# Patient Record
Sex: Male | Born: 1935
Health system: Southern US, Community
[De-identification: ages and names within clinical notes are randomized; demographics above are authoritative.]

## PROBLEM LIST (undated history)

## (undated) DIAGNOSIS — G039 Meningitis, unspecified: Secondary | ICD-10-CM

## (undated) DIAGNOSIS — H539 Unspecified visual disturbance: Secondary | ICD-10-CM

## (undated) HISTORY — DX: Unspecified visual disturbance: H53.9

## (undated) HISTORY — DX: Meningitis, unspecified: G03.9

## (undated) HISTORY — PX: OTHER SURGICAL HISTORY: SHX169

---

## 2012-09-02 ENCOUNTER — Other Ambulatory Visit: Payer: Self-pay | Admitting: Internal Medicine

## 2012-09-02 ENCOUNTER — Encounter: Payer: Self-pay | Admitting: Family Medicine

## 2012-09-02 ENCOUNTER — Telehealth: Payer: Self-pay | Admitting: Family Medicine

## 2012-09-02 ENCOUNTER — Ambulatory Visit (INDEPENDENT_AMBULATORY_CARE_PROVIDER_SITE_OTHER): Payer: Medicare Other | Admitting: Family Medicine

## 2012-09-02 VITALS — BP 176/89 | HR 86 | Ht 68.5 in | Wt 158.0 lb

## 2012-09-02 DIAGNOSIS — H269 Unspecified cataract: Secondary | ICD-10-CM

## 2012-09-02 DIAGNOSIS — S04019A Injury of optic nerve, unspecified eye, initial encounter: Secondary | ICD-10-CM

## 2012-09-02 DIAGNOSIS — H35039 Hypertensive retinopathy, unspecified eye: Secondary | ICD-10-CM

## 2012-09-02 DIAGNOSIS — I10 Essential (primary) hypertension: Secondary | ICD-10-CM

## 2012-09-02 DIAGNOSIS — H3581 Retinal edema: Secondary | ICD-10-CM

## 2012-09-02 DIAGNOSIS — H35033 Hypertensive retinopathy, bilateral: Secondary | ICD-10-CM | POA: Insufficient documentation

## 2012-09-02 LAB — COMPLETE METABOLIC PANEL WITH GFR
Alkaline Phosphatase: 73 U/L (ref 39–117)
BUN: 66 mg/dL — ABNORMAL HIGH (ref 6–23)
Creat: 6 mg/dL — ABNORMAL HIGH (ref 0.50–1.35)
GFR, Est Non African American: 8 mL/min — ABNORMAL LOW
Glucose, Bld: 71 mg/dL (ref 70–99)
Total Bilirubin: 0.2 mg/dL — ABNORMAL LOW (ref 0.3–1.2)

## 2012-09-02 LAB — LIPID PANEL
Cholesterol: 122 mg/dL (ref 0–200)
HDL: 42 mg/dL (ref >39)
Total CHOL/HDL Ratio: 2.9 Ratio

## 2012-09-02 MED ORDER — LISINOPRIL 20 MG PO TABS: 20.0000 mg | ORAL_TABLET | Freq: Every day | ORAL | Status: DC

## 2012-09-02 NOTE — Addendum Note (Signed)
Addended by: Nani Gasser D on: 09/02/2012 01:51 PM   Modules accepted: Orders

## 2012-09-02 NOTE — Patient Instructions (Signed)

## 2012-09-02 NOTE — Progress Notes (Addendum)
Subjective:    Patient ID: Peter Lovely., male    DOB: May 10, 1933, 78 y.o.   MRN: [id number]  HPI 78 year old male who is here to establish care today. He has not seen a physician in at least 7 years. Has not had any blood work at that time. He says he does not do vaccines. He does have a very strong family history of high blood pressure. He says he's been told on multiple occasions that his blood pressure is elevated he has never taken medication for it. He is here with his sister today who is strongly encouraging him to get medical care. He recently went to [facility], hear in [place] and saw Dr. Laveda Norman. His sister reports that she noticed any buildup of fluid in that the right optic nerve showed some abnormalities as well as some potential early glaucoma. He was told to follow up with his primary care physician which he did not have. He denies any chest pain or shortness of breath. She does eat a high salt diet. He is very physically active but no regular exercise per se. No dizziness or lightheadedness or headaches.   Review of Systems  Constitutional: Negative for fever, diaphoresis and unexpected weight change.  HENT: Negative for hearing loss, rhinorrhea, sneezing and tinnitus.   Eyes: Negative for visual disturbance.  Respiratory: Negative for cough and wheezing.   Cardiovascular: Negative for chest pain and palpitations.  Gastrointestinal: Negative for nausea, vomiting, diarrhea and blood in stool.  Genitourinary: Negative for dysuria and discharge.  Musculoskeletal: Negative for myalgias and arthralgias.  Skin: Negative for rash.  Neurological: Negative for headaches.  Hematological: Negative for adenopathy.  Psychiatric/Behavioral: Negative for disturbed wake/sleep cycle and dysphoric mood. The patient is not nervous/anxious.       BP 176/89  Pulse 86  Ht 5' 8.5" (1.74 m)  Wt 158 lb (71.668 kg)  BMI 23.67 kg/m2    No Known Allergies  Past Medical History  Diagnosis  Date  . Visual changes   . Meningitis     hospitalized [facility] 15 years ago    Past Surgical History  Procedure Date  . Amputation toe     [place]    History   Social History  . Marital Status: Widowed    Spouse Name: N/A    Number of Children: N/A  . Years of Education: N/A   Occupational History  . 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 Smoking status: Never Smoker   . Smokeless tobacco: Not on file  . Alcohol Use: No  . Drug Use: No  . Sexually Active: Yes -- Male partner(s)     did not comment on   Other Topics Concern  . Not on file   Social History Narrative   2 caffeine drinks per day. Lives with his sister Peter Phillips.      Family History  Problem Relation Age of Onset  . Hypertension Mother   . Hypertension Father   . Hypertension Sister     Outpatient Encounter Prescriptions as of 09/02/2012  Medication Sig Dispense Refill  . lisinopril (PRINIVIL,ZESTRIL) 20 MG tablet Take 1 tablet (20 mg total) by mouth daily.  30 tablet  1       Objective:   Physical Exam  Constitutional: He is oriented to person, place, and time. He appears well-developed and well-nourished.  HENT:  Head: Normocephalic and atraumatic.  Right Ear: External ear  normal.  Left Ear: External ear normal.  Nose: Nose normal.  Mouth/Throat: Oropharynx is clear and moist.       Left TM is blocked by cerumen  Eyes: Conjunctivae normal are normal. Pupils are equal, round, and reactive to light.       Bilateral cataracts  Neck: Neck supple. No thyromegaly present.  Cardiovascular: Normal rate, regular rhythm and normal heart sounds.        No carotid or abdominal bruits.  Pulmonary/Chest: Effort normal and breath sounds normal.  Abdominal: Bowel sounds are normal. He exhibits no distension and no mass.  Musculoskeletal:       Bilateral trace lower extremity edema  Lymphadenopathy:    He has no cervical adenopathy.    Neurological: He is alert and oriented to person, place, and time.  Skin: Skin is warm and dry.  Psychiatric: He has a normal mood and affect. His behavior is normal.          Assessment & Plan:  Hypertension-new diagnosis. Discussed the importance of treating the disease process to reduce his risk of renal disease, stroke and heart disease. We discussed the importance of a low-salt diet. Also discussed the DASH diet. His sister says she will look into it on line. We also discussed starting a medication we'll start lisinopril 20 mg daily. One of potential side effects including cough, lightheadedness, dizziness, potential for angioedema. Followup in one month. Check CMP and lipid panel to better risk stratify him. We'll also check thyroid level and says his new diagnosis.  We will call to get the report about his eye exam [facility].  Addendum-I. did receive the notes from [facility]. He was evidently diagnosed with bilateral cataracts,is a glaucoma suspect, has macular edema and is being referred to retinal specialist and has hypertensive retinopathy. They did recommend setting him up for carotid Dopplers and they're referring him to retina specialist.

## 2012-09-02 NOTE — Telephone Encounter (Signed)
Please call patient and I did get the records from [facility].  He also needs to be scheduled for a Doppler which is an ultrasound of the blood vessels in his neck. We will order in for this and they should contact him soon. It will be performed at the [facility] location which is 8 miles from our office.

## 2012-09-02 NOTE — Telephone Encounter (Signed)
Please call patient and I did get the records from [facility].  He also needs to be scheduled for a Doppler which is an ultrasound of the blood vessels in his neck. We will order in for this and they should contact him soon. It will be performed at the [place] location which is 8 miles from our office. 

## 2012-09-02 NOTE — Telephone Encounter (Signed)
Sister aware.  

## 2012-09-03 LAB — CBC WITH DIFFERENTIAL/PLATELET
Basophils Absolute: 0 10*3/uL (ref 0.0–0.1)
Basophils Relative: 0 % (ref 0–1)
Eosinophils Relative: 1 % (ref 0–5)
HCT: 22.7 % — ABNORMAL LOW (ref 39.0–52.0)
Lymphocytes Relative: 25 % (ref 12–46)
MCHC: 30.4 g/dL (ref 30.0–36.0)
MCV: 79.9 fL (ref 78.0–100.0)
Monocytes Absolute: 0.8 10*3/uL (ref 0.1–1.0)
Monocytes Relative: 12 % (ref 3–12)
RDW: 17.6 % — ABNORMAL HIGH (ref 11.5–15.5)

## 2012-09-04 ENCOUNTER — Ambulatory Visit (INDEPENDENT_AMBULATORY_CARE_PROVIDER_SITE_OTHER): Payer: Medicare Other | Admitting: Family Medicine

## 2012-09-04 ENCOUNTER — Ambulatory Visit: Payer: Medicare Other | Admitting: Family Medicine

## 2012-09-04 ENCOUNTER — Encounter: Payer: Self-pay | Admitting: Family Medicine

## 2012-09-04 VITALS — BP 140/70 | HR 54 | Ht 68.5 in | Wt 158.0 lb

## 2012-09-04 DIAGNOSIS — R972 Elevated prostate specific antigen [PSA]: Secondary | ICD-10-CM

## 2012-09-04 DIAGNOSIS — N19 Unspecified kidney failure: Secondary | ICD-10-CM

## 2012-09-04 DIAGNOSIS — N039 Chronic nephritic syndrome with unspecified morphologic changes: Secondary | ICD-10-CM

## 2012-09-04 DIAGNOSIS — D631 Anemia in chronic kidney disease: Secondary | ICD-10-CM

## 2012-09-04 NOTE — Progress Notes (Signed)
  Subjective:    Patient ID: Peter Phillips., male    DOB: 04/07/1933, 78 y.o.   MRN: [id number]  HPI  He is here to followup on his lab work today. He was seen for the patient in her office approximately 2 days ago. Prior to that he had not seen a physician him is 3 years. He really denied any urinary problems. No frequency. No problems going to the bathroom at all. He did have some trace ankle edema at that time but otherwise no chest pain or shortness of breath.  His sister Aram Beecham is here with him today.  Review of Systems     Objective:   Physical Exam  Constitutional: He appears well-developed and well-nourished.  Skin: Skin is warm and dry.  Psychiatric: He has a normal mood and affect. His behavior is normal.          Assessment & Plan:  Unfortunately his lab  orresults showed that he is in renal failure. His creatinine was 6.0 and his BUN was elevated as well. It is very interesting that he has not noticed any problems with urination or decrease in urination. We discussed scheduling him with a nephrologist ASAP. He is okay with going to [place] to schedule at [facility] in [place].  Lab results also showed an elevated PSA, greater than 100. This is extremely concerning for prostate cancer. At a level that high it is most likely metastatic. No infection is certainly possible that he's not having any discomfort pain chills or fever. I spoke to the radiologist about what imaging would be best especially with his renal function. They recommended CT without contrast as well as a bone scan. He already has carotid Dopplers scheduled at meds her [place] on Mondays we will try to schedule the CT at the same time. The bone scan will have to be done at [facility].    He also is very anemic. Hemoglobin is 7.0. He says completely asymptomatic. Denies any dizziness lightheadedness fatigue. This is most likely secondary to his renal disease. He wanted additional blood work  including parathyroid levels but I will leave this up to the nephrologist.

## 2012-09-07 ENCOUNTER — Ambulatory Visit (HOSPITAL_BASED_OUTPATIENT_CLINIC_OR_DEPARTMENT_OTHER)
Admission: RE | Admit: 2012-09-07 | Discharge: 2012-09-07 | Disposition: A | Discharge status: Home / Self Care | Payer: Medicare Other | Source: Ambulatory Visit | Attending: Family Medicine | Admitting: Family Medicine

## 2012-09-07 ENCOUNTER — Telehealth: Payer: Self-pay | Admitting: *Deleted

## 2012-09-07 ENCOUNTER — Other Ambulatory Visit: Payer: Self-pay | Admitting: Family Medicine

## 2012-09-07 DIAGNOSIS — I129 Hypertensive chronic kidney disease with stage 1 through stage 4 chronic kidney disease, or unspecified chronic kidney disease: Secondary | ICD-10-CM | POA: Insufficient documentation

## 2012-09-07 DIAGNOSIS — H3581 Retinal edema: Secondary | ICD-10-CM

## 2012-09-07 DIAGNOSIS — R142 Eructation: Secondary | ICD-10-CM | POA: Insufficient documentation

## 2012-09-07 DIAGNOSIS — R141 Gas pain: Secondary | ICD-10-CM | POA: Insufficient documentation

## 2012-09-07 DIAGNOSIS — M47817 Spondylosis without myelopathy or radiculopathy, lumbosacral region: Secondary | ICD-10-CM | POA: Insufficient documentation

## 2012-09-07 DIAGNOSIS — M5146 Schmorl's nodes, lumbar region: Secondary | ICD-10-CM | POA: Insufficient documentation

## 2012-09-07 DIAGNOSIS — R972 Elevated prostate specific antigen [PSA]: Secondary | ICD-10-CM | POA: Insufficient documentation

## 2012-09-07 DIAGNOSIS — N1330 Unspecified hydronephrosis: Secondary | ICD-10-CM | POA: Insufficient documentation

## 2012-09-07 DIAGNOSIS — N189 Chronic kidney disease, unspecified: Secondary | ICD-10-CM | POA: Insufficient documentation

## 2012-09-07 DIAGNOSIS — N320 Bladder-neck obstruction: Secondary | ICD-10-CM

## 2012-09-07 DIAGNOSIS — H539 Unspecified visual disturbance: Secondary | ICD-10-CM | POA: Insufficient documentation

## 2012-09-07 DIAGNOSIS — S04019A Injury of optic nerve, unspecified eye, initial encounter: Secondary | ICD-10-CM

## 2012-09-07 DIAGNOSIS — H35039 Hypertensive retinopathy, unspecified eye: Secondary | ICD-10-CM | POA: Insufficient documentation

## 2012-09-07 DIAGNOSIS — G039 Meningitis, unspecified: Secondary | ICD-10-CM | POA: Insufficient documentation

## 2012-09-07 DIAGNOSIS — H35033 Hypertensive retinopathy, bilateral: Secondary | ICD-10-CM

## 2012-09-07 DIAGNOSIS — H269 Unspecified cataract: Secondary | ICD-10-CM | POA: Insufficient documentation

## 2012-09-07 DIAGNOSIS — C61 Malignant neoplasm of prostate: Secondary | ICD-10-CM

## 2012-09-07 DIAGNOSIS — N19 Unspecified kidney failure: Secondary | ICD-10-CM

## 2012-09-07 DIAGNOSIS — I10 Essential (primary) hypertension: Secondary | ICD-10-CM

## 2012-09-07 NOTE — Telephone Encounter (Signed)
Authorization for CT Abdomen & Pelvis w/o contrast obtained from [facility] Medicare- auth# [id number]. Jasmine December at [employer] notified via phone of auth#

## 2012-09-09 ENCOUNTER — Telehealth: Payer: Self-pay | Admitting: Family Medicine

## 2012-09-09 NOTE — Telephone Encounter (Signed)
Called and spoke with patient's sister, Aram Beecham, per patient request. We have her scheduled with urology for Friday. Results of the CT were given her yesterday. She really didn't have any additional questions.

## 2012-09-17 ENCOUNTER — Telehealth: Payer: Self-pay | Admitting: *Deleted

## 2012-09-17 ENCOUNTER — Other Ambulatory Visit: Payer: Self-pay | Admitting: Obstetrics and Gynecology

## 2012-09-17 ENCOUNTER — Other Ambulatory Visit: Payer: Self-pay | Admitting: Urology

## 2012-09-17 DIAGNOSIS — N289 Disorder of kidney and ureter, unspecified: Secondary | ICD-10-CM

## 2012-09-17 NOTE — Telephone Encounter (Signed)
Calls and request to speak with you personally

## 2012-09-22 ENCOUNTER — Telehealth: Payer: Self-pay | Admitting: *Deleted

## 2012-09-22 NOTE — Telephone Encounter (Signed)
Pt sisiter notifed. Called [facility] precert office at [phone] and spoke with Brett Canales who states no pre-cert needed for bone scan. Sisiter notified of this as well.KG LPN

## 2012-09-22 NOTE — Telephone Encounter (Signed)
Wants to speak with you. Seen Dr. Narda Bonds and he has scheduled him for Lasix renogram for Nov 7th at [facility]. We called her and we scheduled him on Nov 12th for a full body scan at [facility] with contrast. Pt sisiter wants to know if needs to do both of these. They told her that the renogram was to evaluate the kidneys and this was the best way.

## 2012-09-22 NOTE — Telephone Encounter (Signed)
Gavin Pound from [employer] called to say that she tried to schedule Peter Phillips for his appt but his sister would like to wait on making his appointment until she talks to someone in our office about him going. Thanks, [employer]

## 2012-09-22 NOTE — Telephone Encounter (Signed)
Yes, need to do both. One is for furthe eval of the kdineys and one is to see if he may have any prostate cancer cells that have spread into the body.

## 2012-09-22 NOTE — Telephone Encounter (Signed)
Pt will have IV contrast on 09/29/2012 study.

## 2012-09-24 ENCOUNTER — Encounter (HOSPITAL_COMMUNITY)
Admission: RE | Admit: 2012-09-24 | Discharge: 2012-09-24 | Disposition: A | Discharge status: Home / Self Care | Payer: Medicare Other | Source: Ambulatory Visit | Attending: Urology | Admitting: Urology

## 2012-09-24 ENCOUNTER — Other Ambulatory Visit: Payer: Self-pay | Admitting: Urology

## 2012-09-24 DIAGNOSIS — N289 Disorder of kidney and ureter, unspecified: Secondary | ICD-10-CM | POA: Insufficient documentation

## 2012-09-24 DIAGNOSIS — N135 Crossing vessel and stricture of ureter without hydronephrosis: Secondary | ICD-10-CM | POA: Insufficient documentation

## 2012-09-24 DIAGNOSIS — N1330 Unspecified hydronephrosis: Secondary | ICD-10-CM | POA: Insufficient documentation

## 2012-09-24 MED ORDER — TECHNETIUM TC 99M MERTIATIDE: 15.0000 | Freq: Once | INTRAVENOUS | Status: AC | PRN

## 2012-09-24 MED ORDER — FUROSEMIDE 10 MG/ML IJ SOLN
40.0000 mg | Freq: Once | INTRAMUSCULAR | Status: AC
  Administered 2012-09-24: 36 mg via INTRAVENOUS
  Filled 2012-09-24: qty 4

## 2012-09-29 ENCOUNTER — Ambulatory Visit (HOSPITAL_COMMUNITY): Payer: Medicare Other

## 2012-09-29 ENCOUNTER — Encounter (HOSPITAL_COMMUNITY): Payer: Medicare Other

## 2012-09-30 ENCOUNTER — Ambulatory Visit: Payer: Medicare Other | Admitting: Family Medicine

## 2012-10-02 ENCOUNTER — Telehealth: Payer: Self-pay | Admitting: Family Medicine

## 2012-10-02 ENCOUNTER — Encounter: Payer: Self-pay | Admitting: Family Medicine

## 2012-10-02 ENCOUNTER — Ambulatory Visit (INDEPENDENT_AMBULATORY_CARE_PROVIDER_SITE_OTHER): Payer: Medicare Other | Admitting: Family Medicine

## 2012-10-02 VITALS — BP 159/81 | HR 80 | Ht 68.5 in | Wt 154.0 lb

## 2012-10-02 DIAGNOSIS — N139 Obstructive and reflux uropathy, unspecified: Secondary | ICD-10-CM

## 2012-10-02 DIAGNOSIS — I10 Essential (primary) hypertension: Secondary | ICD-10-CM

## 2012-10-02 DIAGNOSIS — N189 Chronic kidney disease, unspecified: Secondary | ICD-10-CM

## 2012-10-02 DIAGNOSIS — R972 Elevated prostate specific antigen [PSA]: Secondary | ICD-10-CM | POA: Insufficient documentation

## 2012-10-02 DIAGNOSIS — N185 Chronic kidney disease, stage 5: Secondary | ICD-10-CM | POA: Insufficient documentation

## 2012-10-02 MED ORDER — METOPROLOL TARTRATE 50 MG PO TABS: 50.0000 mg | ORAL_TABLET | Freq: Two times a day (BID) | ORAL | Status: DC

## 2012-10-02 NOTE — Telephone Encounter (Signed)
Please  call his urologist and see if they still would like [place] to do a whole body bone scan to rule out metastasis for prostate cancer. We have him scheduled on November 25 but if the urologist at this time feels that it's not necessary we could certainly cancel the scan.

## 2012-10-02 NOTE — Progress Notes (Signed)
  Subjective:    Patient ID: Peter Phillips., male    DOB: 06-04-1933, 78 y.o.   MRN: [id number]  HPI Here for f/u for acute renal obstruction. He has had seen the Urologist. He is now performing self catheterizations to help the obstruction. He is scheduled for prostate biopsy in early January. They did go ahead and recommend he schedule his appointment with the nephrologist. Delene Loll been holding off because the nephrologists want him to see a urologist first. He is otherwise doing well. He is tolerating the self catheterizations well. He denies having abdominal pain or other symptoms.  Hypertension-no chest pain or shortness of breath. Taking his medication regularly.no swelling.     Review of Systems     Objective:   Physical Exam  Constitutional: He is oriented to person, place, and time. He appears well-developed and well-nourished.  HENT:  Head: Normocephalic and atraumatic.  Cardiovascular: Normal rate, regular rhythm and normal heart sounds.   Pulmonary/Chest: Effort normal and breath sounds normal.  Neurological: He is alert and oriented to person, place, and time.  Skin: Skin is warm and dry.  Psychiatric: He has a normal mood and affect. His behavior is normal.          Assessment & Plan:  HTN - Not wel controlled.  Will d/c lisinopril bc of renal function and will change to metoprolol. Follow up in 6 weeks to recheck blood pressure.  Chronic kidney disease- at this point I do think he needs to go ahead and see nephrology. The urologist agrees as well. His sister has started in contact with [facility]. She agrees to call them back today to go ahead and schedule an appointment. Would like to recheck his creatinine now that he has completed his antibiotics and he has been self cathing himself.  Elevated PSA-most likely prostate cancer. He schedule for spot in January. His sister is concerned about this. I encouraged her to try call to see if they can mitigate  forward at all possible.  Recheck PSA today. He is scheduled on November 25 for a bone scan to rule out metastasis of the prostate cancer. We will call his urologist to see if they still think this is needed or if they recommend that we hold off at this time.

## 2012-10-02 NOTE — Telephone Encounter (Signed)
Dr. Lenoria Chime nurse will ask and call back. Pt is scheduled for biopsy on 12/23.

## 2012-10-03 LAB — BASIC METABOLIC PANEL WITH GFR
CO2: 18 mEq/L — ABNORMAL LOW (ref 19–32)
GFR, Est African American: 13 mL/min — ABNORMAL LOW
Glucose, Bld: 78 mg/dL (ref 70–99)
Potassium: 4.6 mEq/L (ref 3.5–5.3)
Sodium: 140 mEq/L (ref 135–145)

## 2012-10-03 LAB — PSA: PSA: 91.24 ng/mL — ABNORMAL HIGH (ref <=4.00)

## 2012-10-05 ENCOUNTER — Telehealth: Payer: Self-pay | Admitting: *Deleted

## 2012-10-05 NOTE — Telephone Encounter (Signed)
See other message from Phil Campbell

## 2012-10-06 NOTE — Telephone Encounter (Signed)
Dr. Retta Diones felt we could hold off on the bone scan. Lets call imaging to cancel the study.

## 2012-10-07 NOTE — Telephone Encounter (Signed)
Referral shows that appt has been canceled.

## 2012-10-12 ENCOUNTER — Encounter (HOSPITAL_COMMUNITY): Payer: Medicare Other

## 2012-10-12 ENCOUNTER — Ambulatory Visit (HOSPITAL_COMMUNITY): Payer: Medicare Other

## 2012-11-05 ENCOUNTER — Other Ambulatory Visit (HOSPITAL_COMMUNITY): Payer: Self-pay | Admitting: *Deleted

## 2012-11-06 ENCOUNTER — Encounter (HOSPITAL_COMMUNITY)
Admission: RE | Admit: 2012-11-06 | Discharge: 2012-11-06 | Disposition: A | Discharge status: Home / Self Care | Payer: Medicare Other | Source: Ambulatory Visit | Attending: Nephrology | Admitting: Nephrology

## 2012-11-06 DIAGNOSIS — N189 Chronic kidney disease, unspecified: Secondary | ICD-10-CM | POA: Insufficient documentation

## 2012-11-06 DIAGNOSIS — I129 Hypertensive chronic kidney disease with stage 1 through stage 4 chronic kidney disease, or unspecified chronic kidney disease: Secondary | ICD-10-CM | POA: Insufficient documentation

## 2012-11-06 LAB — POCT HEMOGLOBIN-HEMACUE: Hemoglobin: 6.4 g/dL — CL (ref 13.0–17.0)

## 2012-11-06 MED ORDER — EPOETIN ALFA 10000 UNIT/ML IJ SOLN
10000.0000 [IU] | INTRAMUSCULAR | Status: DC
  Administered 2012-11-06: 10000 [IU] via SUBCUTANEOUS

## 2012-11-06 MED ORDER — EPOETIN ALFA 10000 UNIT/ML IJ SOLN
INTRAMUSCULAR | Status: AC
  Filled 2012-11-06: qty 1

## 2012-11-13 ENCOUNTER — Encounter (HOSPITAL_COMMUNITY)
Admission: RE | Admit: 2012-11-13 | Discharge: 2012-11-13 | Disposition: A | Discharge status: Home / Self Care | Payer: Medicare Other | Source: Ambulatory Visit | Attending: Nephrology | Admitting: Nephrology

## 2012-11-13 ENCOUNTER — Telehealth: Payer: Self-pay | Admitting: Family Medicine

## 2012-11-13 ENCOUNTER — Encounter: Payer: Self-pay | Admitting: Family Medicine

## 2012-11-13 ENCOUNTER — Ambulatory Visit (INDEPENDENT_AMBULATORY_CARE_PROVIDER_SITE_OTHER): Payer: Medicare Other | Admitting: Family Medicine

## 2012-11-13 VITALS — BP 149/80 | HR 69 | Resp 18 | Wt 168.0 lb

## 2012-11-13 DIAGNOSIS — N189 Chronic kidney disease, unspecified: Secondary | ICD-10-CM

## 2012-11-13 DIAGNOSIS — R972 Elevated prostate specific antigen [PSA]: Secondary | ICD-10-CM

## 2012-11-13 DIAGNOSIS — N139 Obstructive and reflux uropathy, unspecified: Secondary | ICD-10-CM

## 2012-11-13 DIAGNOSIS — I10 Essential (primary) hypertension: Secondary | ICD-10-CM

## 2012-11-13 LAB — POCT HEMOGLOBIN-HEMACUE: Hemoglobin: 6.8 g/dL — CL (ref 13.0–17.0)

## 2012-11-13 MED ORDER — FERUMOXYTOL INJECTION 510 MG/17 ML
510.0000 mg | INTRAVENOUS | Status: DC
  Administered 2012-11-13: 510 mg via INTRAVENOUS

## 2012-11-13 MED ORDER — AMLODIPINE BESYLATE 5 MG PO TABS: 5.0000 mg | ORAL_TABLET | Freq: Every day | ORAL | Status: DC

## 2012-11-13 MED ORDER — EPOETIN ALFA 10000 UNIT/ML IJ SOLN
10000.0000 [IU] | INTRAMUSCULAR | Status: DC
  Administered 2012-11-13: 10000 [IU] via SUBCUTANEOUS

## 2012-11-13 MED ORDER — METOPROLOL TARTRATE 50 MG PO TABS: 100.0000 mg | ORAL_TABLET | Freq: Two times a day (BID) | ORAL | Status: DC

## 2012-11-13 MED ORDER — EPOETIN ALFA 10000 UNIT/ML IJ SOLN
INTRAMUSCULAR | Status: AC
  Administered 2012-11-13: 10000 [IU] via SUBCUTANEOUS
  Filled 2012-11-13: qty 1

## 2012-11-13 MED ORDER — SODIUM CHLORIDE 0.9 % IV SOLN: INTRAVENOUS | Status: DC

## 2012-11-13 MED ORDER — FERUMOXYTOL INJECTION 510 MG/17 ML
INTRAVENOUS | Status: AC
  Administered 2012-11-13: 510 mg via INTRAVENOUS
  Filled 2012-11-13: qty 17

## 2012-11-13 NOTE — Progress Notes (Signed)
  Subjective:    Patient ID: Peter Lovely., male    DOB: 11-Jan-1933, 78 y.o.   MRN: [id number]  HPI Now seeing Dr. Hyman Hopes At [facility]. He is now getting procrit injections and schedule for iron infusion.    HTN -  Pt denies chest pain, SOB, dizziness, or heart palpitations.  Taking meds as directed w/o problems.  Denies medication side effects.  He feels well.     Review of Systems     Objective:   Physical Exam  Constitutional: He is oriented to person, place, and time. He appears well-developed and well-nourished.  HENT:  Head: Normocephalic and atraumatic.  Cardiovascular: Normal rate, regular rhythm and normal heart sounds.   Pulmonary/Chest: Effort normal and breath sounds normal.  Neurological: He is alert and oriented to person, place, and time.  Skin: Skin is warm and dry.  Psychiatric: He has a normal mood and affect. His behavior is normal.          Assessment & Plan:  CKD - Seeing Dr. Hyman Hopes. Will call to get notes.   HTN - Much improved but not at goal.  Will increase metoprolol to 100mg  BID.  F.U in 8 weeks.  Doing well.    Obstructive uropathy-he did have a biopsy performed on Monday but we'll not know the results for about another 2 weeks. It sounds like the person who typically interprets the pathology reports is on vacation.  Reminded him to keep his followup in April for his eye exam since the report did show some early cataracts as well as some possible glaucoma and damage from high blood pressure.

## 2012-11-13 NOTE — Telephone Encounter (Signed)
Call [facility]. Like to get notes from his nephrologist, Dr. Hyman Hopes. I have not gotten any notes from her office yet

## 2012-11-16 NOTE — Telephone Encounter (Signed)
Called Dr Marland Mcalpine office and they will fax over the office notes.

## 2012-11-23 ENCOUNTER — Other Ambulatory Visit: Payer: Self-pay | Admitting: Urology

## 2012-11-23 ENCOUNTER — Encounter (HOSPITAL_COMMUNITY)
Admission: RE | Admit: 2012-11-23 | Discharge: 2012-11-23 | Disposition: A | Discharge status: Home / Self Care | Payer: Medicare Other | Source: Ambulatory Visit | Attending: Nephrology | Admitting: Nephrology

## 2012-11-23 DIAGNOSIS — I120 Hypertensive chronic kidney disease with stage 5 chronic kidney disease or end stage renal disease: Secondary | ICD-10-CM | POA: Insufficient documentation

## 2012-11-23 DIAGNOSIS — N185 Chronic kidney disease, stage 5: Secondary | ICD-10-CM | POA: Insufficient documentation

## 2012-11-23 DIAGNOSIS — C61 Malignant neoplasm of prostate: Secondary | ICD-10-CM

## 2012-11-23 DIAGNOSIS — D631 Anemia in chronic kidney disease: Secondary | ICD-10-CM | POA: Insufficient documentation

## 2012-11-23 LAB — POCT HEMOGLOBIN-HEMACUE: Hemoglobin: 7.5 g/dL — ABNORMAL LOW (ref 13.0–17.0)

## 2012-11-23 MED ORDER — FERUMOXYTOL INJECTION 510 MG/17 ML
INTRAVENOUS | Status: AC
  Administered 2012-11-23: 510 mg
  Filled 2012-11-23: qty 17

## 2012-11-23 MED ORDER — EPOETIN ALFA 10000 UNIT/ML IJ SOLN
10000.0000 [IU] | INTRAMUSCULAR | Status: DC
  Administered 2012-11-23: 10000 [IU] via SUBCUTANEOUS

## 2012-11-23 MED ORDER — EPOETIN ALFA 10000 UNIT/ML IJ SOLN
INTRAMUSCULAR | Status: AC
  Filled 2012-11-23: qty 1

## 2012-11-23 MED ORDER — FERUMOXYTOL INJECTION 510 MG/17 ML
510.0000 mg | INTRAVENOUS | Status: DC
Start: 2012-11-23 — End: 2012-11-24

## 2012-11-23 MED ORDER — SODIUM CHLORIDE 0.9 % IV SOLN
INTRAVENOUS | Status: AC
  Administered 2012-11-23: 11:00:00 via INTRAVENOUS

## 2012-11-30 ENCOUNTER — Encounter (HOSPITAL_COMMUNITY)
Admission: RE | Admit: 2012-11-30 | Discharge: 2012-11-30 | Disposition: A | Discharge status: Home / Self Care | Payer: Medicare Other | Source: Ambulatory Visit | Attending: Nephrology | Admitting: Nephrology

## 2012-11-30 LAB — IRON AND TIBC: UIBC: 216 ug/dL (ref 125–400)

## 2012-11-30 MED ORDER — CLONIDINE HCL 0.1 MG PO TABS
ORAL_TABLET | ORAL | Status: AC
  Filled 2012-11-30: qty 1

## 2012-11-30 MED ORDER — EPOETIN ALFA 10000 UNIT/ML IJ SOLN
INTRAMUSCULAR | Status: AC
  Administered 2012-11-30: 10000 [IU] via SUBCUTANEOUS
  Filled 2012-11-30: qty 1

## 2012-11-30 MED ORDER — EPOETIN ALFA 10000 UNIT/ML IJ SOLN: 10000.0000 [IU] | INTRAMUSCULAR | Status: DC

## 2012-11-30 MED ORDER — EPOETIN ALFA 10000 UNIT/ML IJ SOLN
INTRAMUSCULAR | Status: AC
  Filled 2012-11-30: qty 1

## 2012-11-30 MED ORDER — CLONIDINE HCL 0.1 MG PO TABS
0.1000 mg | ORAL_TABLET | Freq: Once | ORAL | Status: AC | PRN
  Administered 2012-11-30: 0.1 mg via ORAL

## 2012-12-03 ENCOUNTER — Encounter (HOSPITAL_COMMUNITY)
Admission: RE | Admit: 2012-12-03 | Discharge: 2012-12-03 | Disposition: A | Discharge status: Home / Self Care | Payer: Medicare Other | Source: Ambulatory Visit | Attending: Urology | Admitting: Urology

## 2012-12-03 ENCOUNTER — Encounter (HOSPITAL_COMMUNITY): Payer: Self-pay

## 2012-12-03 DIAGNOSIS — C61 Malignant neoplasm of prostate: Secondary | ICD-10-CM | POA: Insufficient documentation

## 2012-12-03 MED ORDER — TECHNETIUM TC 99M MEDRONATE IV KIT
24.0000 | PACK | Freq: Once | INTRAVENOUS | Status: AC | PRN
  Administered 2012-12-03: 24 via INTRAVENOUS

## 2012-12-04 ENCOUNTER — Encounter: Payer: Self-pay | Admitting: Family Medicine

## 2012-12-07 ENCOUNTER — Encounter (HOSPITAL_COMMUNITY)
Admission: RE | Admit: 2012-12-07 | Discharge: 2012-12-07 | Disposition: A | Discharge status: Home / Self Care | Payer: Medicare Other | Source: Ambulatory Visit | Attending: Nephrology | Admitting: Nephrology

## 2012-12-07 ENCOUNTER — Encounter: Payer: Self-pay | Admitting: Internal Medicine

## 2012-12-07 LAB — POCT HEMOGLOBIN-HEMACUE: Hemoglobin: 10.9 g/dL — ABNORMAL LOW (ref 13.0–17.0)

## 2012-12-07 MED ORDER — EPOETIN ALFA 10000 UNIT/ML IJ SOLN
INTRAMUSCULAR | Status: AC
  Filled 2012-12-07: qty 1

## 2012-12-07 MED ORDER — EPOETIN ALFA 10000 UNIT/ML IJ SOLN
10000.0000 [IU] | INTRAMUSCULAR | Status: DC
  Administered 2012-12-07: 10000 [IU] via SUBCUTANEOUS

## 2012-12-08 ENCOUNTER — Ambulatory Visit: Payer: Medicare Other | Admitting: Internal Medicine

## 2012-12-11 ENCOUNTER — Other Ambulatory Visit (HOSPITAL_COMMUNITY): Payer: Self-pay | Admitting: *Deleted

## 2012-12-14 ENCOUNTER — Encounter (HOSPITAL_COMMUNITY)
Admission: RE | Admit: 2012-12-14 | Discharge: 2012-12-14 | Disposition: A | Discharge status: Home / Self Care | Payer: Medicare Other | Source: Ambulatory Visit | Attending: Nephrology | Admitting: Nephrology

## 2012-12-14 LAB — POCT HEMOGLOBIN-HEMACUE: Hemoglobin: 10.3 g/dL — ABNORMAL LOW (ref 13.0–17.0)

## 2012-12-14 MED ORDER — FERUMOXYTOL INJECTION 510 MG/17 ML
INTRAVENOUS | Status: AC
  Administered 2012-12-14: 510 mg via INTRAVENOUS
  Filled 2012-12-14: qty 17

## 2012-12-14 MED ORDER — SODIUM CHLORIDE 0.9 % IV SOLN
INTRAVENOUS | Status: DC
  Administered 2012-12-14: 14:00:00 via INTRAVENOUS

## 2012-12-14 MED ORDER — FERUMOXYTOL INJECTION 510 MG/17 ML
510.0000 mg | INTRAVENOUS | Status: DC
  Administered 2012-12-14: 510 mg via INTRAVENOUS

## 2012-12-14 MED ORDER — EPOETIN ALFA 10000 UNIT/ML IJ SOLN
10000.0000 [IU] | INTRAMUSCULAR | Status: DC
  Administered 2012-12-14: 10000 [IU] via SUBCUTANEOUS

## 2012-12-14 MED ORDER — EPOETIN ALFA 10000 UNIT/ML IJ SOLN
INTRAMUSCULAR | Status: AC
  Administered 2012-12-14: 10000 [IU] via SUBCUTANEOUS
  Filled 2012-12-14: qty 1

## 2012-12-21 ENCOUNTER — Encounter (HOSPITAL_COMMUNITY)
Admission: RE | Admit: 2012-12-21 | Discharge: 2012-12-21 | Disposition: A | Discharge status: Home / Self Care | Payer: Medicare Other | Source: Ambulatory Visit | Attending: Nephrology | Admitting: Nephrology

## 2012-12-21 DIAGNOSIS — N039 Chronic nephritic syndrome with unspecified morphologic changes: Secondary | ICD-10-CM | POA: Insufficient documentation

## 2012-12-21 DIAGNOSIS — I129 Hypertensive chronic kidney disease with stage 1 through stage 4 chronic kidney disease, or unspecified chronic kidney disease: Secondary | ICD-10-CM | POA: Insufficient documentation

## 2012-12-21 DIAGNOSIS — D631 Anemia in chronic kidney disease: Secondary | ICD-10-CM | POA: Insufficient documentation

## 2012-12-21 DIAGNOSIS — N189 Chronic kidney disease, unspecified: Secondary | ICD-10-CM | POA: Insufficient documentation

## 2012-12-21 LAB — POCT HEMOGLOBIN-HEMACUE: Hemoglobin: 10.9 g/dL — ABNORMAL LOW (ref 13.0–17.0)

## 2012-12-21 MED ORDER — FERUMOXYTOL INJECTION 510 MG/17 ML
INTRAVENOUS | Status: AC
  Administered 2012-12-21: 510 mg via INTRAVENOUS
  Filled 2012-12-21: qty 17

## 2012-12-21 MED ORDER — SODIUM CHLORIDE 0.9 % IV SOLN
INTRAVENOUS | Status: AC
  Administered 2012-12-21: 10:00:00 via INTRAVENOUS

## 2012-12-21 MED ORDER — EPOETIN ALFA 10000 UNIT/ML IJ SOLN
INTRAMUSCULAR | Status: AC
  Administered 2012-12-21: 10000 [IU] via SUBCUTANEOUS
  Filled 2012-12-21: qty 1

## 2012-12-21 MED ORDER — EPOETIN ALFA 10000 UNIT/ML IJ SOLN: 10000.0000 [IU] | INTRAMUSCULAR | Status: DC

## 2012-12-21 MED ORDER — FERUMOXYTOL INJECTION 510 MG/17 ML
510.0000 mg | INTRAVENOUS | Status: AC
  Administered 2012-12-21: 510 mg via INTRAVENOUS

## 2012-12-28 ENCOUNTER — Encounter (HOSPITAL_COMMUNITY)
Admission: RE | Admit: 2012-12-28 | Discharge: 2012-12-28 | Disposition: A | Discharge status: Home / Self Care | Payer: Medicare Other | Source: Ambulatory Visit | Attending: Nephrology | Admitting: Nephrology

## 2012-12-28 LAB — POCT HEMOGLOBIN-HEMACUE: Hemoglobin: 13.3 g/dL (ref 13.0–17.0)

## 2012-12-28 LAB — IRON AND TIBC
Saturation Ratios: 22 % (ref 20–55)
UIBC: 181 ug/dL (ref 125–400)

## 2012-12-28 MED ORDER — EPOETIN ALFA 10000 UNIT/ML IJ SOLN: 10000.0000 [IU] | INTRAMUSCULAR | Status: DC

## 2013-01-08 ENCOUNTER — Telehealth: Payer: Self-pay | Admitting: Family Medicine

## 2013-01-08 ENCOUNTER — Ambulatory Visit (INDEPENDENT_AMBULATORY_CARE_PROVIDER_SITE_OTHER): Payer: Medicare Other | Admitting: Family Medicine

## 2013-01-08 ENCOUNTER — Encounter: Payer: Self-pay | Admitting: Family Medicine

## 2013-01-08 VITALS — BP 176/89 | HR 57 | Ht 68.6 in | Wt 170.0 lb

## 2013-01-08 DIAGNOSIS — I120 Hypertensive chronic kidney disease with stage 5 chronic kidney disease or end stage renal disease: Secondary | ICD-10-CM

## 2013-01-08 DIAGNOSIS — N139 Obstructive and reflux uropathy, unspecified: Secondary | ICD-10-CM

## 2013-01-08 DIAGNOSIS — N185 Chronic kidney disease, stage 5: Secondary | ICD-10-CM

## 2013-01-08 DIAGNOSIS — I10 Essential (primary) hypertension: Secondary | ICD-10-CM

## 2013-01-08 DIAGNOSIS — D509 Iron deficiency anemia, unspecified: Secondary | ICD-10-CM

## 2013-01-08 MED ORDER — METOPROLOL TARTRATE 100 MG PO TABS: 100.0000 mg | ORAL_TABLET | Freq: Two times a day (BID) | ORAL | Status: DC

## 2013-01-08 MED ORDER — AMLODIPINE BESYLATE 10 MG PO TABS: 10.0000 mg | ORAL_TABLET | Freq: Every day | ORAL | Status: DC

## 2013-01-08 NOTE — Telephone Encounter (Signed)
LM on Dr. Hebert Soho asst. Line to give [place] a call back. Barry Dienes, LPN

## 2013-01-08 NOTE — Progress Notes (Signed)
  Subjective:    Patient ID: Peter Lovely., male    DOB: June 01, 1933, 79 y.o.   MRN: [id number]  HPI HTN-  Pt denies chest pain, SOB, dizziness, or heart palpitations.  Taking meds as directed w/o problems.  Denies medication side effects. His nephrologist, Dr. Hyman Hopes, recently increased his amlodipine to 10 mg at bedtime. His blood pressure was high when he was there as well. He says recently when he was at the hospital his systolic blood pressure was around 180. He says he feels completely asymptomatic. He reports that he has changed his diet has really cut back on salt, but his sister seems to disagree with him.  He is being followed by Dr. Elvis Coil for chronic kidney disease stage 5.   His kidney function has actually improved since he has been cathing his bladder. There still following his prostate and evaluating that further.    Review of Systems     Objective:   Physical Exam  Constitutional: He is oriented to person, place, and time. He appears well-developed and well-nourished.  HENT:  Head: Normocephalic and atraumatic.  Eyes: Conjunctivae are normal. Pupils are equal, round, and reactive to light.  Cardiovascular: Normal rate, regular rhythm and normal heart sounds.   Pulmonary/Chest: Effort normal and breath sounds normal.  Musculoskeletal: He exhibits no edema.  Neurological: He is alert and oriented to person, place, and time.  Skin: Skin is warm and dry.  Psychiatric: He has a normal mood and affect. His behavior is normal.          Assessment & Plan:  HTN - uncontrolled.  Doing well overall.  I explained to him that now that he is relieved the pressure off his bladder it has really helped his kidneys and now we need to reduce the blood pressure, pressure off of his kidneys to continue to prevent damage to them. I tried to explain to him how the kidneys work as a filter and that having high blood pressure continue meds that his filters long-term. I would like to add  hydralazine to his regimen that we will contact Dr. Marland Mcalpine office to make sure that this is okay before call the medication. It sounds like he may have a followup appointment with Dr. Hyman Hopes next week which is perfect. They can recheck his blood pressure there otherwise follow with me in 6 weeks.  CKD 5 - secondary to obstructive uropathy. His kidney function has improved mildly. Has followup with Dr. Elvis Coil next week.  Obstructive uropathy-has followup with urology in March.  Iron deficiency anemia-he canceled his colonoscopy. He says he is not actively bleeding and says feels he does not need it. I tried to encourage him to reconsider. His sister was here with him today.

## 2013-01-08 NOTE — Telephone Encounter (Signed)
Please call Dr. Virginia Crews office, nephrology in [place], [place] is okay if we add hydralazine for blood pressure control for this patient. He's currently on amlodipine and metoprolol. His blood pressure was still not controlled in our office today. He is still running around 170.

## 2013-01-08 NOTE — Telephone Encounter (Signed)
Shaquina Dr. Hyman Hopes assistant calls back and says she will talk with him and get back to you. May be Monday before hears anything. Barry Dienes, LPN

## 2013-01-11 ENCOUNTER — Encounter (HOSPITAL_COMMUNITY)
Admission: RE | Admit: 2013-01-11 | Discharge: 2013-01-11 | Disposition: A | Discharge status: Home / Self Care | Payer: Medicare Other | Source: Ambulatory Visit | Attending: Nephrology | Admitting: Nephrology

## 2013-01-11 LAB — POCT HEMOGLOBIN-HEMACUE: Hemoglobin: 12.9 g/dL — ABNORMAL LOW (ref 13.0–17.0)

## 2013-01-11 MED ORDER — EPOETIN ALFA 10000 UNIT/ML IJ SOLN: 10000.0000 [IU] | INTRAMUSCULAR | Status: DC

## 2013-01-11 MED ORDER — HYDRALAZINE HCL 25 MG PO TABS: 25.0000 mg | ORAL_TABLET | Freq: Three times a day (TID) | ORAL | Status: DC

## 2013-01-11 NOTE — Telephone Encounter (Signed)
Okay. Please call patient and let him know that we did get a response back from Dr. Hyman Hopes. He is okay with starting hydralazine to better control his blood pressure. I will send a new but she never to his pharmacy. He is a 3, date drugs to try to his best to take it that often. Make sure to keep followup appointment with me in one month if he doesn't already have one.

## 2013-01-11 NOTE — Telephone Encounter (Signed)
Leonides Sake Dr Hebert Soho assistant states Dr Hyman Hopes is ok with he start of the Hydralazine.

## 2013-01-11 NOTE — Telephone Encounter (Signed)
Left message for patient's daughter to call back. 

## 2013-01-12 NOTE — Telephone Encounter (Signed)
LMOM to return call. Rhyder Koegel, LPN  

## 2013-01-22 ENCOUNTER — Other Ambulatory Visit (HOSPITAL_COMMUNITY): Payer: Self-pay | Admitting: *Deleted

## 2013-01-25 ENCOUNTER — Encounter (HOSPITAL_COMMUNITY)
Admission: RE | Admit: 2013-01-25 | Discharge: 2013-01-25 | Disposition: A | Discharge status: Home / Self Care | Payer: Medicare Other | Source: Ambulatory Visit | Attending: Nephrology | Admitting: Nephrology

## 2013-01-25 DIAGNOSIS — I129 Hypertensive chronic kidney disease with stage 1 through stage 4 chronic kidney disease, or unspecified chronic kidney disease: Secondary | ICD-10-CM | POA: Insufficient documentation

## 2013-01-25 DIAGNOSIS — N189 Chronic kidney disease, unspecified: Secondary | ICD-10-CM | POA: Insufficient documentation

## 2013-01-25 LAB — RENAL FUNCTION PANEL
Albumin: 3.6 g/dL (ref 3.5–5.2)
BUN: 44 mg/dL — ABNORMAL HIGH (ref 6–23)
Chloride: 105 mEq/L (ref 96–112)
GFR calc Af Amer: 14 mL/min — ABNORMAL LOW (ref >90)
Glucose, Bld: 86 mg/dL (ref 70–99)
Potassium: 4.4 mEq/L (ref 3.5–5.1)
Sodium: 139 mEq/L (ref 135–145)

## 2013-01-25 LAB — POCT HEMOGLOBIN-HEMACUE: Hemoglobin: 13 g/dL (ref 13.0–17.0)

## 2013-01-25 MED ORDER — EPOETIN ALFA 10000 UNIT/ML IJ SOLN: 10000.0000 [IU] | INTRAMUSCULAR | Status: DC

## 2013-02-05 ENCOUNTER — Other Ambulatory Visit (HOSPITAL_COMMUNITY): Payer: Self-pay | Admitting: *Deleted

## 2013-02-08 ENCOUNTER — Encounter (HOSPITAL_COMMUNITY)
Admission: RE | Admit: 2013-02-08 | Discharge: 2013-02-08 | Disposition: A | Discharge status: Home / Self Care | Payer: Medicare Other | Source: Ambulatory Visit | Attending: Nephrology | Admitting: Nephrology

## 2013-02-08 LAB — POCT HEMOGLOBIN-HEMACUE: Hemoglobin: 13.1 g/dL (ref 13.0–17.0)

## 2013-02-08 MED ORDER — EPOETIN ALFA 10000 UNIT/ML IJ SOLN: 10000.0000 [IU] | INTRAMUSCULAR | Status: DC

## 2013-02-22 ENCOUNTER — Encounter (HOSPITAL_COMMUNITY): Payer: Medicare Other

## 2013-02-28 ENCOUNTER — Other Ambulatory Visit: Payer: Self-pay | Admitting: Family Medicine

## 2013-04-18 ENCOUNTER — Other Ambulatory Visit: Payer: Self-pay | Admitting: Family Medicine

## 2014-06-24 ENCOUNTER — Telehealth: Payer: Self-pay

## 2014-06-24 NOTE — Telephone Encounter (Signed)
Left a message for Peter Phillips to call back to schedule a follow up for HTN and other chronic problems.

## 2015-11-17 DIAGNOSIS — H25813 Combined forms of age-related cataract, bilateral: Secondary | ICD-10-CM | POA: Diagnosis not present

## 2015-11-17 DIAGNOSIS — H35372 Puckering of macula, left eye: Secondary | ICD-10-CM | POA: Diagnosis not present

## 2015-11-17 DIAGNOSIS — H353132 Nonexudative age-related macular degeneration, bilateral, intermediate dry stage: Secondary | ICD-10-CM | POA: Diagnosis not present

## 2015-11-17 DIAGNOSIS — H35342 Macular cyst, hole, or pseudohole, left eye: Secondary | ICD-10-CM | POA: Diagnosis not present

## 2015-11-17 DIAGNOSIS — H40003 Preglaucoma, unspecified, bilateral: Secondary | ICD-10-CM | POA: Diagnosis not present

## 2015-12-14 DIAGNOSIS — H33101 Unspecified retinoschisis, right eye: Secondary | ICD-10-CM | POA: Diagnosis not present

## 2015-12-14 DIAGNOSIS — H35342 Macular cyst, hole, or pseudohole, left eye: Secondary | ICD-10-CM | POA: Diagnosis not present

## 2015-12-14 DIAGNOSIS — H35372 Puckering of macula, left eye: Secondary | ICD-10-CM | POA: Diagnosis not present

## 2015-12-14 DIAGNOSIS — H353132 Nonexudative age-related macular degeneration, bilateral, intermediate dry stage: Secondary | ICD-10-CM | POA: Diagnosis not present

## 2015-12-14 DIAGNOSIS — H25813 Combined forms of age-related cataract, bilateral: Secondary | ICD-10-CM | POA: Diagnosis not present

## 2016-01-23 DIAGNOSIS — H40003 Preglaucoma, unspecified, bilateral: Secondary | ICD-10-CM | POA: Diagnosis not present

## 2016-01-23 DIAGNOSIS — H35372 Puckering of macula, left eye: Secondary | ICD-10-CM | POA: Diagnosis not present

## 2016-01-23 DIAGNOSIS — H35342 Macular cyst, hole, or pseudohole, left eye: Secondary | ICD-10-CM | POA: Diagnosis not present

## 2016-01-23 DIAGNOSIS — H25813 Combined forms of age-related cataract, bilateral: Secondary | ICD-10-CM | POA: Diagnosis not present

## 2016-01-23 DIAGNOSIS — H353132 Nonexudative age-related macular degeneration, bilateral, intermediate dry stage: Secondary | ICD-10-CM | POA: Diagnosis not present

## 2016-03-13 DIAGNOSIS — H35342 Macular cyst, hole, or pseudohole, left eye: Secondary | ICD-10-CM | POA: Diagnosis not present

## 2016-03-13 DIAGNOSIS — H40003 Preglaucoma, unspecified, bilateral: Secondary | ICD-10-CM | POA: Diagnosis not present

## 2016-03-13 DIAGNOSIS — H25813 Combined forms of age-related cataract, bilateral: Secondary | ICD-10-CM | POA: Diagnosis not present

## 2016-03-14 ENCOUNTER — Encounter: Payer: Self-pay | Admitting: Family Medicine

## 2016-03-14 ENCOUNTER — Ambulatory Visit (INDEPENDENT_AMBULATORY_CARE_PROVIDER_SITE_OTHER): Payer: Medicare Other | Admitting: Family Medicine

## 2016-03-14 VITALS — BP 201/97 | HR 94 | Ht 68.6 in | Wt 172.0 lb

## 2016-03-14 DIAGNOSIS — I10 Essential (primary) hypertension: Secondary | ICD-10-CM | POA: Diagnosis not present

## 2016-03-14 DIAGNOSIS — N185 Chronic kidney disease, stage 5: Secondary | ICD-10-CM | POA: Diagnosis not present

## 2016-03-14 DIAGNOSIS — H35033 Hypertensive retinopathy, bilateral: Secondary | ICD-10-CM | POA: Diagnosis not present

## 2016-03-14 DIAGNOSIS — H3581 Retinal edema: Secondary | ICD-10-CM

## 2016-03-14 DIAGNOSIS — E875 Hyperkalemia: Secondary | ICD-10-CM

## 2016-03-14 MED ORDER — AMLODIPINE BESYLATE 10 MG PO TABS: 10.0000 mg | ORAL_TABLET | Freq: Every day | ORAL | Status: DC

## 2016-03-14 MED ORDER — METOPROLOL TARTRATE 100 MG PO TABS: 100.0000 mg | ORAL_TABLET | Freq: Two times a day (BID) | ORAL | Status: DC

## 2016-03-14 NOTE — Progress Notes (Signed)
Subjective:    Patient ID: Peter Cushing., male    DOB: 05/18/1933, 82 y.o.   MRN: [id number]  HPI HTN - She comes in today to restart his blood pressure medications which he has not been taking. He says he needs to get his blood pressure down so that he can have his eye surgery or treatment of glaucoma. Stopped his meds 3 year sago.    He is on eyedrops for his glaucoma and is scheduled for surgery on May11.  CKD-last some I saw him he had obstructive uropathy from his prostate that was causing significant impairment of his renal function at that time his last creatinine was 4.25.  Lab Results  Component Value Date   CREATININE 4.25* 01/25/2013   BUN 44* 01/25/2013   NA 139 01/25/2013   K 4.4 01/25/2013   CL 105 01/25/2013   CO2 22 01/25/2013      Review of Systems  BP 201/97 mmHg  Pulse 94  Ht 5' 8.6" (1.742 m)  Wt 172 lb (78.019 kg)  BMI 25.71 kg/m2  SpO2 100%    No Known Allergies  Past Medical History  Diagnosis Date  . Visual changes   . Meningitis     hospitalized [facility] 15 years ago    Past Surgical History  Procedure Laterality Date  . Amputation toe      s/p infection while in the [place]    Social History   Social History  . Marital Status: Married    Spouse Name: N/A  . Number of Children: N/A  . Years of Education: N/A   Occupational History  . Retired     Sports coach employed   Social History Main Topics  . Smoking status: Never Smoker   . Smokeless tobacco: Not on file  . Alcohol Use: No  . Drug Use: No  . Sexual Activity:    Partners: Female     Comment: did not comment on   Other Topics Concern  . Not on file   Social History Narrative   2 caffeine drinks per day. Lives with his sister Caren Griffins [place].      Family History  Problem Relation Age of Onset  . Hypertension Mother   . Hypertension Father   . Hypertension Sister     Outpatient Encounter Prescriptions as of 03/14/2016  Medication Sig  . amLODipine (NORVASC) 10 MG  tablet Take 1 tablet (10 mg total) by mouth at bedtime.  . ciprofloxacin (CILOXAN) 0.3 % ophthalmic solution   . COMBIGAN 0.2-0.5 % ophthalmic solution   . ketorolac (ACULAR) 0.5 % ophthalmic solution   . metoprolol (LOPRESSOR) 100 MG tablet Take 1 tablet (100 mg total) by mouth 2 (two) times daily.  . prednisoLONE acetate (PRED FORTE) 1 % ophthalmic suspension   . timolol (TIMOPTIC) 0.5 % ophthalmic solution   . [DISCONTINUED] amLODipine (NORVASC) 10 MG tablet Take 1 tablet (10 mg total) by mouth at bedtime.  . [DISCONTINUED] hydrALAZINE (APRESOLINE) 25 MG tablet Take 1 tablet (25 mg total) by mouth 3 (three) times daily.  . [DISCONTINUED] metoprolol (LOPRESSOR) 100 MG tablet Take 1 tablet (100 mg total) by mouth 2 (two) times daily.  . [DISCONTINUED] metoprolol (LOPRESSOR) 50 MG tablet TAKE TWO TABLETS BY MOUTH TWICE DAILY   No facility-administered encounter medications on file as of 03/14/2016.          Objective:   Physical Exam  Constitutional: He is oriented to person, place, and time. He appears well-developed and well-nourished.  HENT:  Head: Normocephalic and atraumatic.  Cardiovascular: Normal rate, regular rhythm and normal heart sounds.   No carotid or abdominal bruits.   Pulmonary/Chest: Effort normal and breath sounds normal.  Abdominal: Soft. Bowel sounds are normal. He exhibits no distension and no mass. There is no tenderness. There is no rebound and no guarding.  Neurological: He is alert and oriented to person, place, and time.  Skin: Skin is warm and dry.  Psychiatric: He has a normal mood and affect. His behavior is normal.          Assessment & Plan:  HTN - Uncontrolled. Will restart metoprolol and amlodipine.  Start the metoprolol and then on Monday  Add the amlodipine.  F/U mid next week to make sure BP is at goal before surgery. I stressed the importance of continuing his medications even after surgery which he says he will likely quit. Explained how  this can still impact his glaucoma and eventually cause blindness.  Gluacoma - scheduled for surgery on May 11th.    CKD- Due to recheck kidney function. We'll also check liver enzymes.

## 2016-03-14 NOTE — Patient Instructions (Addendum)
Start the metoprolol first. It's taken twice a day. On Monday evening add the amlodipine. He will be taking both prescriptions. Please schedule a follow-up appointment around May 3 or fourth says that we can make sure that your blood pressure looks good and if we need to make any adjustments at that time we can before your surgery on the 11th.    DASH Eating Plan DASH stands for "Dietary Approaches to Stop Hypertension." The DASH eating plan is a healthy eating plan that has been shown to reduce high blood pressure (hypertension). Additional health benefits may include reducing the risk of type 2 diabetes mellitus, heart disease, and stroke. The DASH eating plan may also help with weight loss. WHAT DO I NEED TO KNOW ABOUT THE DASH EATING PLAN? For the DASH eating plan, you will follow these general guidelines:  Choose foods with a percent daily value for sodium of less than 5% (as listed on the food label).  Use salt-free seasonings or herbs instead of table salt or sea salt.  Check with your health care provider or pharmacist before using salt substitutes.  Eat lower-sodium products, often labeled as "lower sodium" or "no salt added."  Eat fresh foods.  Eat more vegetables, fruits, and low-fat dairy products.  Choose whole grains. Look for the word "whole" as the first word in the ingredient list.  Choose fish and skinless chicken or [place] more often than red meat. Limit fish, poultry, and meat to 6 oz (170 g) each day.  Limit sweets, desserts, sugars, and sugary drinks.  Choose heart-healthy fats.  Limit cheese to 1 oz (28 g) per day.  Eat more home-cooked food and less restaurant, buffet, and fast food.  Limit fried foods.  Cook foods using methods other than frying.  Limit canned vegetables. If you do use them, rinse them well to decrease the sodium.  When eating at a restaurant, ask that your food be prepared with less salt, or no salt if possible. WHAT FOODS CAN I  EAT? Seek help from a dietitian for individual calorie needs. Grains Whole grain or whole wheat bread. Brown rice. Whole grain or whole wheat pasta. Quinoa, bulgur, and whole grain cereals. Low-sodium cereals. Corn or whole wheat flour tortillas. Whole grain cornbread. Whole grain crackers. Low-sodium crackers. Vegetables Fresh or frozen vegetables (raw, steamed, roasted, or grilled). Low-sodium or reduced-sodium tomato and vegetable juices. Low-sodium or reduced-sodium tomato sauce and paste. Low-sodium or reduced-sodium canned vegetables.  Fruits All fresh, canned (in natural juice), or frozen fruits. Meat and Other Protein Products Ground beef (85% or leaner), grass-fed beef, or beef trimmed of fat. Skinless chicken or [place]. Ground chicken or [place]. Pork trimmed of fat. All fish and seafood. Eggs. Dried beans, peas, or lentils. Unsalted nuts and seeds. Unsalted canned beans. Dairy Low-fat dairy products, such as skim or 1% milk, 2% or reduced-fat cheeses, low-fat ricotta or cottage cheese, or plain low-fat yogurt. Low-sodium or reduced-sodium cheeses. Fats and Oils Tub margarines without trans fats. Light or reduced-fat mayonnaise and salad dressings (reduced sodium). Avocado. Safflower, olive, or canola oils. Natural peanut or almond butter. Other Unsalted popcorn and pretzels. The items listed above may not be a complete list of recommended foods or beverages. Contact your dietitian for more options. WHAT FOODS ARE NOT RECOMMENDED? Grains White bread. White pasta. White rice. Refined cornbread. Bagels and croissants. Crackers that contain trans fat. Vegetables Creamed or fried vegetables. Vegetables in a cheese sauce. Regular canned vegetables. Regular canned tomato sauce and paste.  Regular tomato and vegetable juices. Fruits Dried fruits. Canned fruit in light or heavy syrup. Fruit juice. Meat and Other Protein Products Fatty cuts of meat. Ribs, chicken wings, bacon, sausage,  bologna, salami, chitterlings, fatback, hot dogs, bratwurst, and packaged luncheon meats. Salted nuts and seeds. Canned beans with salt. Dairy Whole or 2% milk, cream, half-and-half, and cream cheese. Whole-fat or sweetened yogurt. Full-fat cheeses or blue cheese. Nondairy creamers and whipped toppings. Processed cheese, cheese spreads, or cheese curds. Condiments Onion and garlic salt, seasoned salt, table salt, and sea salt. Canned and packaged gravies. Worcestershire sauce. Tartar sauce. Barbecue sauce. Teriyaki sauce. Soy sauce, including reduced sodium. Steak sauce. Fish sauce. Oyster sauce. Cocktail sauce. Horseradish. Ketchup and mustard. Meat flavorings and tenderizers. Bouillon cubes. Hot sauce. Tabasco sauce. Marinades. Taco seasonings. Relishes. Fats and Oils Butter, stick margarine, lard, shortening, ghee, and bacon fat. Coconut, palm kernel, or palm oils. Regular salad dressings. Other Pickles and olives. Salted popcorn and pretzels. The items listed above may not be a complete list of foods and beverages to avoid. Contact your dietitian for more information. WHERE CAN I FIND MORE INFORMATION? National Heart, Lung, and Blood Institute: travelstabloid.com   This information is not intended to replace advice given to you by your health care provider. Make sure you discuss any questions you have with your health care provider.   Document Released: 10/24/2011 Document Revised: 11/25/2014 Document Reviewed: 09/08/2013 Elsevier Interactive Patient Education [employer].

## 2016-03-15 LAB — LIPID PANEL
CHOL/HDL RATIO: 5.2 ratio — AB (ref <=5.0)
CHOLESTEROL: 181 mg/dL (ref 125–200)
HDL: 35 mg/dL — AB (ref >=40)
LDL Cholesterol: 120 mg/dL (ref <130)
Triglycerides: 130 mg/dL (ref <150)
VLDL: 26 mg/dL (ref <30)

## 2016-03-15 LAB — COMPLETE METABOLIC PANEL WITH GFR
ALBUMIN: 3.8 g/dL (ref 3.6–5.1)
ALK PHOS: 93 U/L (ref 40–115)
ALT: 7 U/L — ABNORMAL LOW (ref 9–46)
AST: 11 U/L (ref 10–35)
BUN: 54 mg/dL — ABNORMAL HIGH (ref 7–25)
CALCIUM: 6.3 mg/dL — AB (ref 8.6–10.3)
CO2: 20 mmol/L (ref 20–31)
Chloride: 106 mmol/L (ref 98–110)
Creat: 5.83 mg/dL — ABNORMAL HIGH (ref 0.70–1.11)
GFR, EST NON AFRICAN AMERICAN: 8 mL/min — AB (ref >=60)
GFR, Est African American: 10 mL/min — ABNORMAL LOW (ref >=60)
Glucose, Bld: 118 mg/dL — ABNORMAL HIGH (ref 65–99)
POTASSIUM: 5.4 mmol/L — AB (ref 3.5–5.3)
Sodium: 142 mmol/L (ref 135–146)
Total Bilirubin: 0.3 mg/dL (ref 0.2–1.2)
Total Protein: 7.6 g/dL (ref 6.1–8.1)

## 2016-03-15 NOTE — Addendum Note (Signed)
Addended by: Teddy Spike on: 03/15/2016 09:27 AM   Modules accepted: Orders

## 2016-03-18 ENCOUNTER — Other Ambulatory Visit: Payer: Self-pay | Admitting: *Deleted

## 2016-03-18 DIAGNOSIS — E875 Hyperkalemia: Secondary | ICD-10-CM | POA: Diagnosis not present

## 2016-03-18 MED ORDER — AMLODIPINE BESYLATE 10 MG PO TABS: 10.0000 mg | ORAL_TABLET | Freq: Every day | ORAL | Status: DC

## 2016-03-19 LAB — BASIC METABOLIC PANEL
BUN: 51 mg/dL — AB (ref 7–25)
CALCIUM: 6.4 mg/dL — AB (ref 8.6–10.3)
CO2: 21 mmol/L (ref 20–31)
Chloride: 102 mmol/L (ref 98–110)
Creat: 5.73 mg/dL — ABNORMAL HIGH (ref 0.70–1.11)
GLUCOSE: 102 mg/dL — AB (ref 65–99)
POTASSIUM: 5.3 mmol/L (ref 3.5–5.3)
Sodium: 137 mmol/L (ref 135–146)

## 2016-03-21 ENCOUNTER — Encounter: Payer: Self-pay | Admitting: Family Medicine

## 2016-03-21 ENCOUNTER — Ambulatory Visit (INDEPENDENT_AMBULATORY_CARE_PROVIDER_SITE_OTHER): Payer: Medicare Other | Admitting: Family Medicine

## 2016-03-21 VITALS — BP 146/66 | HR 66 | Wt 173.0 lb

## 2016-03-21 DIAGNOSIS — N185 Chronic kidney disease, stage 5: Secondary | ICD-10-CM | POA: Diagnosis not present

## 2016-03-21 DIAGNOSIS — I10 Essential (primary) hypertension: Secondary | ICD-10-CM

## 2016-03-21 NOTE — Progress Notes (Signed)
   Subjective:    Patient ID: Peter Phillips., male    DOB: 1933/06/18, 82 y.o.   MRN: [id number]  HPI Hypertension- Pt denies chest pain, SOB, dizziness, or heart palpitations.  Taking meds as directed w/o problems.  Denies medication side effects.  He says he is really only taking the blood pressure medications that he can get his eye surgery.   CKD 5-  Right now he is not wanting to see the kidney specialist. He was Seeing nephrology back in 2013 but has not been back since.   Review of Systems     Objective:   Physical Exam  Constitutional: He is oriented to person, place, and time. He appears well-developed and well-nourished.  HENT:  Head: Normocephalic and atraumatic.  Cardiovascular: Normal rate, regular rhythm and normal heart sounds.   Pulmonary/Chest: Effort normal and breath sounds normal.  Neurological: He is alert and oriented to person, place, and time.  Skin: Skin is warm and dry.  Psychiatric: He has a normal mood and affect. His behavior is normal.          Assessment & Plan:  HTN - much improved.Almost at goal after 5 days on medication. Continue current regimen. Follow-up in one month. If he starts to feel poorly on the medications we can certainly make adjustments but he really has had an impressive drop in pressure for just adding the amlodipine and metoprolol on board. Strongly encouraged him to continue medications to help reduce further progression of his kidney disease.  CKD 5 - encouraged him to consider going back to see the nephrologist. He says he will let me know after his surgery.

## 2016-03-28 DIAGNOSIS — H35372 Puckering of macula, left eye: Secondary | ICD-10-CM | POA: Diagnosis not present

## 2016-03-28 DIAGNOSIS — I120 Hypertensive chronic kidney disease with stage 5 chronic kidney disease or end stage renal disease: Secondary | ICD-10-CM | POA: Diagnosis not present

## 2016-03-28 DIAGNOSIS — N185 Chronic kidney disease, stage 5: Secondary | ICD-10-CM | POA: Diagnosis not present

## 2016-03-28 DIAGNOSIS — Z7982 Long term (current) use of aspirin: Secondary | ICD-10-CM | POA: Diagnosis not present

## 2016-03-28 DIAGNOSIS — H25811 Combined forms of age-related cataract, right eye: Secondary | ICD-10-CM | POA: Diagnosis not present

## 2016-03-28 DIAGNOSIS — Z79899 Other long term (current) drug therapy: Secondary | ICD-10-CM | POA: Diagnosis not present

## 2016-03-28 DIAGNOSIS — H40003 Preglaucoma, unspecified, bilateral: Secondary | ICD-10-CM | POA: Diagnosis not present

## 2016-03-28 DIAGNOSIS — H527 Unspecified disorder of refraction: Secondary | ICD-10-CM | POA: Diagnosis not present

## 2016-03-28 DIAGNOSIS — H353132 Nonexudative age-related macular degeneration, bilateral, intermediate dry stage: Secondary | ICD-10-CM | POA: Diagnosis not present

## 2016-03-28 DIAGNOSIS — H35033 Hypertensive retinopathy, bilateral: Secondary | ICD-10-CM | POA: Diagnosis not present

## 2016-03-28 DIAGNOSIS — H25813 Combined forms of age-related cataract, bilateral: Secondary | ICD-10-CM | POA: Diagnosis not present

## 2016-03-28 DIAGNOSIS — H35342 Macular cyst, hole, or pseudohole, left eye: Secondary | ICD-10-CM | POA: Diagnosis not present

## 2016-03-28 DIAGNOSIS — H43813 Vitreous degeneration, bilateral: Secondary | ICD-10-CM | POA: Diagnosis not present

## 2016-04-09 DIAGNOSIS — H353132 Nonexudative age-related macular degeneration, bilateral, intermediate dry stage: Secondary | ICD-10-CM | POA: Diagnosis not present

## 2016-04-09 DIAGNOSIS — H25813 Combined forms of age-related cataract, bilateral: Secondary | ICD-10-CM | POA: Diagnosis not present

## 2016-04-09 DIAGNOSIS — H25812 Combined forms of age-related cataract, left eye: Secondary | ICD-10-CM | POA: Diagnosis not present

## 2016-04-09 DIAGNOSIS — I10 Essential (primary) hypertension: Secondary | ICD-10-CM | POA: Diagnosis not present

## 2016-04-09 DIAGNOSIS — Z79899 Other long term (current) drug therapy: Secondary | ICD-10-CM | POA: Diagnosis not present

## 2016-04-09 DIAGNOSIS — H40003 Preglaucoma, unspecified, bilateral: Secondary | ICD-10-CM | POA: Diagnosis not present

## 2016-04-17 ENCOUNTER — Ambulatory Visit (INDEPENDENT_AMBULATORY_CARE_PROVIDER_SITE_OTHER): Payer: Medicare Other | Admitting: Family Medicine

## 2016-04-17 ENCOUNTER — Encounter: Payer: Self-pay | Admitting: Family Medicine

## 2016-04-17 VITALS — BP 158/82 | HR 83 | Wt 170.0 lb

## 2016-04-17 DIAGNOSIS — I10 Essential (primary) hypertension: Secondary | ICD-10-CM

## 2016-04-17 DIAGNOSIS — H6122 Impacted cerumen, left ear: Secondary | ICD-10-CM

## 2016-04-17 DIAGNOSIS — R42 Dizziness and giddiness: Secondary | ICD-10-CM | POA: Diagnosis not present

## 2016-04-17 NOTE — Progress Notes (Signed)
Subjective:    Patient ID: Peter Phillips., male    DOB: Jan 13, 1933, 82 y.o.   MRN: [id number]  HPI Hypertension- Pt denies chest pain, SOB, dizziness, or heart palpitations.  Taking meds as directed w/o problems.  Denies medication side effects.  He says he is taking his medication. Though, his sister who is here with him today as not convinced that he's been taking it regularly.  pt reports that after his eye surgery he began feeling dizzy.  He had his surgery last Tuesday. He says he has a little bit better the last couple of days. He describes it as feeling dizzy especially when he moves around. He denies any syncope or near-syncope. He denies any upper respiratory symptoms or allergy symptoms such as sneezing or nasal congestion etc. No fevers chills or sweats. He denies any nausea after the anesthesia. He did have some type of an IV anesthesia.      Review of Systems   BP 158/82 mmHg  Pulse 83  Wt 170 lb (77.111 kg)  SpO2 97%    No Known Allergies  Past Medical History  Diagnosis Date  . Visual changes   . Meningitis     hospitalized [facility] 15 years ago    Past Surgical History  Procedure Laterality Date  . Amputation toe      s/p infection while in the [place]    Social History   Social History  . Marital Status: Married    Spouse Name: N/A  . Number of Children: N/A  . Years of Education: N/A   Occupational History  . Retired     Sports coach employed   Social History Main Topics  . Smoking status: Never Smoker   . Smokeless tobacco: Not on file  . Alcohol Use: No  . Drug Use: No  . Sexual Activity:    Partners: Female     Comment: did not comment on   Other Topics Concern  . Not on file   Social History Narrative   2 caffeine drinks per day. Lives with his sister Peter Phillips.      Family History  Problem Relation Age of Onset  . Hypertension Mother   . Hypertension Father   . Hypertension Sister     Outpatient Encounter Prescriptions as of  04/17/2016  Medication Sig  . amLODipine (NORVASC) 10 MG tablet Take 1 tablet (10 mg total) by mouth at bedtime.  . ciprofloxacin (CILOXAN) 0.3 % ophthalmic solution   . COMBIGAN 0.2-0.5 % ophthalmic solution   . ketorolac (ACULAR) 0.5 % ophthalmic solution   . metoprolol (LOPRESSOR) 100 MG tablet Take 1 tablet (100 mg total) by mouth 2 (two) times daily.  . prednisoLONE acetate (PRED FORTE) 1 % ophthalmic suspension   . timolol (TIMOPTIC) 0.5 % ophthalmic solution    No facility-administered encounter medications on file as of 04/17/2016.           Objective:   Physical Exam  Constitutional: He is oriented to person, place, and time. He appears well-developed and well-nourished.  HENT:  Head: Normocephalic and atraumatic.  Right Ear: External ear normal.  Left Ear: External ear normal.  Nose: Nose normal.  Mouth/Throat: Oropharynx is clear and moist.  Left ear canal with cerumen impaction. Right TM and canal is clear.  Eyes: Conjunctivae and EOM are normal. Pupils are equal, round, and reactive to light.  Neck: Neck supple. No thyromegaly present.  Cardiovascular: Normal rate and normal heart sounds.   Pulmonary/Chest: Effort  normal and breath sounds normal.  Lymphadenopathy:    He has no cervical adenopathy.  Neurological: He is alert and oriented to person, place, and time.  Skin: Skin is warm and dry.  Psychiatric: He has a normal mood and affect.       Assessment & Plan:  HTN - Uncontrolled today.The day he actually went for his eye surgery his blood pressure was at goal. His sister brought in a copy of the paperwork. It is up a little bit but he says he is taking his medication. Just encouraged him to make sure he is taking it consistently and to really watch salt intake in the diet. I like to see him back in a couple months just to make sure that her blood pressure is coming back down. That he seems very resistant to continuing to take his medications  long-term.  Dizziness-seems to be improving over the last couple of days on its own. Suspect it may be secondary to recent anesthesia. I really don't think it's on the blood pressure medication because his dose is unchanged since prior to surgery and he was not going symptomatically at that time.  Left ear canal cerumen impaction-offer to irrigate versus home drops. Irrigation performed and he tolerated aell.

## 2016-07-18 ENCOUNTER — Ambulatory Visit: Payer: Medicare Other | Admitting: Family Medicine

## 2017-12-24 ENCOUNTER — Other Ambulatory Visit: Payer: Self-pay | Admitting: *Deleted

## 2017-12-24 ENCOUNTER — Telehealth: Payer: Self-pay

## 2017-12-24 DIAGNOSIS — I10 Essential (primary) hypertension: Secondary | ICD-10-CM

## 2017-12-24 DIAGNOSIS — N185 Chronic kidney disease, stage 5: Secondary | ICD-10-CM

## 2017-12-24 NOTE — Telephone Encounter (Signed)
PT's sister Caren Griffins wants to have blood work put in so he can be checked out. He's feeling weak and has chills. No fever. Please call Caren Griffins [phone] to advise what she can do. Offered appt at 74 with Vassie Moselle states she just wants lab work ordered and she'll call in the morning to get him in an acute spot.

## 2017-12-24 NOTE — Telephone Encounter (Signed)
Spoke w/pt's sister and lab slip given.Peter Phillips, [name]

## 2017-12-25 ENCOUNTER — Ambulatory Visit (INDEPENDENT_AMBULATORY_CARE_PROVIDER_SITE_OTHER): Payer: Medicare Other | Admitting: Family Medicine

## 2017-12-25 ENCOUNTER — Encounter: Payer: Self-pay | Admitting: Family Medicine

## 2017-12-25 ENCOUNTER — Ambulatory Visit (INDEPENDENT_AMBULATORY_CARE_PROVIDER_SITE_OTHER): Payer: Medicare Other

## 2017-12-25 VITALS — BP 128/64 | HR 85 | Temp 98.6°F | Wt 150.0 lb

## 2017-12-25 DIAGNOSIS — M25511 Pain in right shoulder: Secondary | ICD-10-CM | POA: Diagnosis not present

## 2017-12-25 DIAGNOSIS — S42291A Other displaced fracture of upper end of right humerus, initial encounter for closed fracture: Secondary | ICD-10-CM

## 2017-12-25 DIAGNOSIS — I10 Essential (primary) hypertension: Secondary | ICD-10-CM | POA: Diagnosis not present

## 2017-12-25 DIAGNOSIS — W01198A Fall on same level from slipping, tripping and stumbling with subsequent striking against other object, initial encounter: Secondary | ICD-10-CM

## 2017-12-25 DIAGNOSIS — G8929 Other chronic pain: Secondary | ICD-10-CM

## 2017-12-25 DIAGNOSIS — N185 Chronic kidney disease, stage 5: Secondary | ICD-10-CM | POA: Diagnosis not present

## 2017-12-25 DIAGNOSIS — I499 Cardiac arrhythmia, unspecified: Secondary | ICD-10-CM

## 2017-12-25 DIAGNOSIS — R600 Localized edema: Secondary | ICD-10-CM

## 2017-12-25 DIAGNOSIS — M19011 Primary osteoarthritis, right shoulder: Secondary | ICD-10-CM | POA: Diagnosis not present

## 2017-12-25 NOTE — Progress Notes (Signed)
Subjective:    Patient ID: Peter Phillips., male    DOB: Jun 15, 1933, 84 y.o.   MRN: [id number]  HPI 84 year old male with a history of hypertension and CKD 5 comes in today complaining of more acute symptoms including weakness and chills for one week.  He has swelling in right arm and hand and both feet for about a week. He tried to go for labs yesterday but they were unable to draw them. They encouraged him to drink water and then come back today.    He is been having some bilateral swelling his in his feet for about 3 weeks.  He is not currently taking any medications and has not for well over a year.  He also reports on Thanksgiving day he was out in the yard and his cane broke.  He fell forward in his shoulder on the right side hit a tree.  He did not seek medical care at that time but since then he has had severely limited range of motion and has had intermittent swelling in his right arm and hand.  Review of Systems  BP 128/64   Pulse 85   Temp 98.6 F (37 C)   Wt 150 lb (68 kg)   BMI 22.41 kg/m     No Known Allergies  Past Medical History:  Diagnosis Date  . Meningitis    hospitalized [facility] 15 years ago  . Visual changes     Past Surgical History:  Procedure Laterality Date  . amputation toe     s/p infection while in the [place] History   Socioeconomic History  . Marital status: Married    Spouse name: Not on file  . Number of children: Not on file  . Years of education: Not on file  . Highest education level: Not on file  Social Needs  . Financial resource strain: Not on file  . Food insecurity - worry: Not on file  . Food insecurity - inability: Not on file  . Transportation needs - medical: Not on file  . Transportation needs - non-medical: Not on file  Occupational History  . Occupation: Retired    Comment: Self employed  Tobacco Use  . Smoking status: Never Smoker  . Smokeless tobacco: Never Used  Substance and Sexual Activity  .  Alcohol use: No  . Drug use: No  . Sexual activity: Yes    Partners: Female    Comment: did not comment on  Other Topics Concern  . Not on file  Social History Narrative   2 caffeine drinks per day. Lives with his sister Caren Griffins [place].      Family History  Problem Relation Age of Onset  . Hypertension Mother   . Hypertension Father   . Hypertension Sister     Outpatient Encounter Medications as of 12/25/2017  Medication Sig  . prednisoLONE acetate (PRED FORTE) 1 % ophthalmic suspension   . timolol (TIMOPTIC) 0.5 % ophthalmic solution   . amLODipine (NORVASC) 10 MG tablet Take 1 tablet (10 mg total) by mouth at bedtime. (Patient not taking: Reported on 12/25/2017)  . ciprofloxacin (CILOXAN) 0.3 % ophthalmic solution   . COMBIGAN 0.2-0.5 % ophthalmic solution   . ketorolac (ACULAR) 0.5 % ophthalmic solution   . metoprolol (LOPRESSOR) 100 MG tablet Take 1 tablet (100 mg total) by mouth 2 (two) times daily. (Patient not taking: Reported on 12/25/2017)   No facility-administered encounter medications on file as of 12/25/2017.  Objective:   Physical Exam  Constitutional: He is oriented to person, place, and time. He appears well-developed and well-nourished.  HENT:  Head: Normocephalic and atraumatic.  Right Ear: External ear normal.  Left Ear: External ear normal.  Nose: Nose normal.  Mouth/Throat: Oropharynx is clear and moist.  TMs and canals are clear.   Eyes: Conjunctivae and EOM are normal. Pupils are equal, round, and reactive to light.  Neck: Neck supple. No thyromegaly present.  Cardiovascular: Normal rate and normal heart sounds.  No murmur heard. Rhythm is irregular but rate is within the normal range.  Pulmonary/Chest: Effort normal and breath sounds normal.  Musculoskeletal:  Bilateral 1+ edema up to the knees bilaterally.  He has 1+ edema in his right hand and forearm.  The head of the humerus is very protuberant on the right shoulder compared to the left.   He is only able to extend his arm 5 may be 5-10 degrees at the most.  He is able to raise his shoulder up.  Lymphadenopathy:    He has no cervical adenopathy.  Neurological: He is alert and oriented to person, place, and time.  Skin: Skin is warm and dry.  Psychiatric: He has a normal mood and affect.        Assessment & Plan:  Bilat lower extremity swelling - likely from renal failure.  Also check a BNP to evaluate for heart failure as well as a TSH to evaluate for thyroid disorder.  HTN - Well controlled. Continue current regimen.    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    Right shoulder injury-he really is experiencing a lot of pain but based on his exam today I am am concerned that the shoulder is actually dislocated and that is causing some chronic swelling in his right arm and hand.  Consider other less likely causes such as a DVT.  Weakness -unclear etiology at this point.  He is not had any infectious symptoms such as as per upper respiratory symptoms, cough fever etc.  It may just be that his renal disease is progressing to the point that he probably needs dialysis.  Irregular heart rhythm-EKG shows rate of 97 bpm.  He has multiple P wave morphologies with an irregularity of the rhythm.  Sign of possible old anterior infarct with possible old Q waves in V2 and V3.  He does have prolonged QT as well.  Unfortunately, I do not have an old EKG for comparison.

## 2017-12-25 NOTE — Addendum Note (Signed)
Addended by: Teddy Spike on: 12/25/2017 12:42 PM   Modules accepted: Orders

## 2017-12-26 ENCOUNTER — Telehealth: Payer: Self-pay | Admitting: Osteopathic Medicine

## 2017-12-26 ENCOUNTER — Encounter: Payer: Self-pay | Admitting: Osteopathic Medicine

## 2017-12-26 DIAGNOSIS — Z4901 Encounter for fitting and adjustment of extracorporeal dialysis catheter: Secondary | ICD-10-CM | POA: Diagnosis not present

## 2017-12-26 DIAGNOSIS — S42291A Other displaced fracture of upper end of right humerus, initial encounter for closed fracture: Secondary | ICD-10-CM | POA: Diagnosis not present

## 2017-12-26 DIAGNOSIS — N185 Chronic kidney disease, stage 5: Secondary | ICD-10-CM | POA: Diagnosis not present

## 2017-12-26 DIAGNOSIS — D649 Anemia, unspecified: Secondary | ICD-10-CM | POA: Diagnosis not present

## 2017-12-26 DIAGNOSIS — I081 Rheumatic disorders of both mitral and tricuspid valves: Secondary | ICD-10-CM | POA: Diagnosis not present

## 2017-12-26 DIAGNOSIS — I517 Cardiomegaly: Secondary | ICD-10-CM | POA: Diagnosis not present

## 2017-12-26 DIAGNOSIS — S42301A Unspecified fracture of shaft of humerus, right arm, initial encounter for closed fracture: Secondary | ICD-10-CM | POA: Diagnosis not present

## 2017-12-26 DIAGNOSIS — I471 Supraventricular tachycardia: Secondary | ICD-10-CM | POA: Diagnosis not present

## 2017-12-26 DIAGNOSIS — B9620 Unspecified Escherichia coli [E. coli] as the cause of diseases classified elsewhere: Secondary | ICD-10-CM | POA: Diagnosis not present

## 2017-12-26 DIAGNOSIS — N170 Acute kidney failure with tubular necrosis: Secondary | ICD-10-CM | POA: Diagnosis not present

## 2017-12-26 DIAGNOSIS — N1330 Unspecified hydronephrosis: Secondary | ICD-10-CM | POA: Diagnosis not present

## 2017-12-26 DIAGNOSIS — N320 Bladder-neck obstruction: Secondary | ICD-10-CM | POA: Diagnosis not present

## 2017-12-26 DIAGNOSIS — N186 End stage renal disease: Secondary | ICD-10-CM | POA: Diagnosis not present

## 2017-12-26 DIAGNOSIS — R9341 Abnormal radiologic findings on diagnostic imaging of renal pelvis, ureter, or bladder: Secondary | ICD-10-CM | POA: Diagnosis not present

## 2017-12-26 DIAGNOSIS — R0989 Other specified symptoms and signs involving the circulatory and respiratory systems: Secondary | ICD-10-CM | POA: Diagnosis not present

## 2017-12-26 DIAGNOSIS — T8789 Other complications of amputation stump: Secondary | ICD-10-CM | POA: Diagnosis not present

## 2017-12-26 DIAGNOSIS — N189 Chronic kidney disease, unspecified: Secondary | ICD-10-CM | POA: Diagnosis not present

## 2017-12-26 DIAGNOSIS — R601 Generalized edema: Secondary | ICD-10-CM | POA: Diagnosis not present

## 2017-12-26 DIAGNOSIS — M25511 Pain in right shoulder: Secondary | ICD-10-CM | POA: Diagnosis not present

## 2017-12-26 DIAGNOSIS — I120 Hypertensive chronic kidney disease with stage 5 chronic kidney disease or end stage renal disease: Secondary | ICD-10-CM | POA: Diagnosis not present

## 2017-12-26 DIAGNOSIS — Z992 Dependence on renal dialysis: Secondary | ICD-10-CM | POA: Diagnosis not present

## 2017-12-26 DIAGNOSIS — R531 Weakness: Secondary | ICD-10-CM | POA: Diagnosis not present

## 2017-12-26 DIAGNOSIS — N261 Atrophy of kidney (terminal): Secondary | ICD-10-CM | POA: Diagnosis not present

## 2017-12-26 DIAGNOSIS — N401 Enlarged prostate with lower urinary tract symptoms: Secondary | ICD-10-CM | POA: Diagnosis not present

## 2017-12-26 DIAGNOSIS — D72829 Elevated white blood cell count, unspecified: Secondary | ICD-10-CM | POA: Diagnosis not present

## 2017-12-26 DIAGNOSIS — I5189 Other ill-defined heart diseases: Secondary | ICD-10-CM | POA: Diagnosis not present

## 2017-12-26 DIAGNOSIS — D631 Anemia in chronic kidney disease: Secondary | ICD-10-CM | POA: Diagnosis not present

## 2017-12-26 DIAGNOSIS — J90 Pleural effusion, not elsewhere classified: Secondary | ICD-10-CM | POA: Diagnosis not present

## 2017-12-26 DIAGNOSIS — N179 Acute kidney failure, unspecified: Secondary | ICD-10-CM | POA: Diagnosis not present

## 2017-12-26 DIAGNOSIS — N3001 Acute cystitis with hematuria: Secondary | ICD-10-CM | POA: Diagnosis not present

## 2017-12-26 DIAGNOSIS — N184 Chronic kidney disease, stage 4 (severe): Secondary | ICD-10-CM | POA: Diagnosis not present

## 2017-12-26 DIAGNOSIS — R600 Localized edema: Secondary | ICD-10-CM | POA: Diagnosis not present

## 2017-12-26 DIAGNOSIS — E872 Acidosis: Secondary | ICD-10-CM | POA: Diagnosis not present

## 2017-12-26 DIAGNOSIS — R918 Other nonspecific abnormal finding of lung field: Secondary | ICD-10-CM | POA: Diagnosis not present

## 2017-12-26 LAB — CBC WITH DIFFERENTIAL/PLATELET
Basophils Absolute: 36 cells/uL (ref 0–200)
Basophils Relative: 0.2 %
EOS ABS: 18 {cells}/uL (ref 15–500)
Eosinophils Relative: 0.1 %
HEMATOCRIT: 14.7 % — AB (ref 38.5–50.0)
Hemoglobin: 4.6 g/dL — CL (ref 13.2–17.1)
LYMPHS ABS: 864 {cells}/uL (ref 850–3900)
MCH: 25.3 pg — AB (ref 27.0–33.0)
MCHC: 31.3 g/dL — ABNORMAL LOW (ref 32.0–36.0)
MCV: 80.8 fL (ref 80.0–100.0)
MPV: 12.8 fL — ABNORMAL HIGH (ref 7.5–12.5)
Monocytes Relative: 6.2 %
NEUTROS PCT: 88.7 %
Neutro Abs: 15966 cells/uL — ABNORMAL HIGH (ref 1500–7800)
PLATELETS: 237 10*3/uL (ref 140–400)
RBC: 1.82 10*6/uL — AB (ref 4.20–5.80)
RDW: 14.7 % (ref 11.0–15.0)
TOTAL LYMPHOCYTE: 4.8 %
WBC: 18 10*3/uL — AB (ref 3.8–10.8)
WBCMIX: 1116 {cells}/uL — AB (ref 200–950)

## 2017-12-26 LAB — COMPLETE METABOLIC PANEL WITH GFR
AG RATIO: 0.9 (calc) — AB (ref 1.0–2.5)
ALBUMIN MSPROF: 3 g/dL — AB (ref 3.6–5.1)
ALKALINE PHOSPHATASE (APISO): 232 U/L — AB (ref 40–115)
ALT: 51 U/L — ABNORMAL HIGH (ref 9–46)
AST: 12 U/L (ref 10–35)
BILIRUBIN TOTAL: 0.3 mg/dL (ref 0.2–1.2)
BUN / CREAT RATIO: 14 (calc) (ref 6–22)
BUN: 103 mg/dL — ABNORMAL HIGH (ref 7–25)
CHLORIDE: 109 mmol/L (ref 98–110)
CO2: 14 mmol/L — ABNORMAL LOW (ref 20–32)
Calcium: 5.2 mg/dL — CL (ref 8.6–10.3)
Creat: 7.3 mg/dL — ABNORMAL HIGH (ref 0.70–1.11)
GFR, EST AFRICAN AMERICAN: 7 mL/min/{1.73_m2} — AB (ref >=60)
GFR, Est Non African American: 6 mL/min/{1.73_m2} — ABNORMAL LOW (ref >=60)
GLOBULIN: 3.4 g/dL (ref 1.9–3.7)
GLUCOSE: 101 mg/dL — AB (ref 65–99)
POTASSIUM: 5.1 mmol/L (ref 3.5–5.3)
SODIUM: 140 mmol/L (ref 135–146)
TOTAL PROTEIN: 6.4 g/dL (ref 6.1–8.1)

## 2017-12-26 LAB — LIPID PANEL
CHOLESTEROL: 108 mg/dL (ref <200)
HDL: 24 mg/dL — AB (ref >40)
LDL CHOLESTEROL (CALC): 64 mg/dL
Non-HDL Cholesterol (Calc): 84 mg/dL (calc) (ref <130)
TRIGLYCERIDES: 115 mg/dL (ref <150)
Total CHOL/HDL Ratio: 4.5 (calc) (ref <5.0)

## 2017-12-26 LAB — URINALYSIS, MICROSCOPIC ONLY
Hyaline Cast: NONE SEEN /LPF
SQUAMOUS EPITHELIAL / LPF: NONE SEEN /HPF (ref <=5)

## 2017-12-26 LAB — TSH: TSH: 0.7 m[IU]/L (ref 0.40–4.50)

## 2017-12-26 LAB — BRAIN NATRIURETIC PEPTIDE: Brain Natriuretic Peptide: 1438 pg/mL — ABNORMAL HIGH (ref <100)

## 2017-12-26 LAB — PTH, INTACT AND CALCIUM
CALCIUM: 5.3 mg/dL — AB (ref 8.6–10.3)
PTH: 225 pg/mL — AB (ref 14–64)

## 2017-12-26 LAB — CBC MORPHOLOGY

## 2017-12-26 MED ORDER — SODIUM CHLORIDE 0.9 % IV SOLN
INTRAVENOUS | Status: DC
Start: ? — End: 2017-12-26

## 2017-12-26 MED ORDER — METOPROLOL TARTRATE 25 MG PO TABS
100.00 | ORAL_TABLET | ORAL | Status: DC
Start: 2017-12-26 — End: 2017-12-26

## 2017-12-26 MED ORDER — SODIUM BICARBONATE 650 MG PO TABS
650.00 | ORAL_TABLET | ORAL | Status: DC
Start: 2017-12-26 — End: 2017-12-26

## 2017-12-26 NOTE — Progress Notes (Signed)
Received critical call around 1 AM for low hemoglobin to 4.6. Reviewed today's progress note and other available labs. Wbc also high. CMP not yet resulted. Called patients number. Spoke to sister, Caren Griffins who stated he was asleep and I could speak to her. Unable to verify DPR but given the nature of the emergency I went ahead and let her know that he had severe anemia that required urgent treatment. She said she would wake him up and ta ke him to the emergency department. I called [facility] in [place] to speak to ED triage, no answer. Will try again in five minutes.

## 2017-12-26 NOTE — Telephone Encounter (Signed)
Error - note entered from [place] is present in Chart Review under Notes tab Happy the patient is admitted and stable at the moment

## 2018-01-02 ENCOUNTER — Telehealth: Payer: Self-pay

## 2018-01-02 MED ORDER — GENERIC EXTERNAL MEDICATION
5.00 | Status: DC
Start: ? — End: 2018-01-02

## 2018-01-02 MED ORDER — DIPHENHYDRAMINE HCL 50 MG/ML IJ SOLN
12.50 | INTRAMUSCULAR | Status: DC
Start: ? — End: 2018-01-02

## 2018-01-02 MED ORDER — ONDANSETRON HCL 4 MG/2ML IJ SOLN
4.00 | INTRAMUSCULAR | Status: DC
Start: ? — End: 2018-01-02

## 2018-01-02 MED ORDER — ALBUMIN HUMAN 25 % IV SOLN
12.50 | INTRAVENOUS | Status: DC
Start: ? — End: 2018-01-02

## 2018-01-02 MED ORDER — ALTEPLASE 2 MG IJ SOLR
2.00 | INTRAMUSCULAR | Status: DC
Start: ? — End: 2018-01-02

## 2018-01-02 MED ORDER — SODIUM CHLORIDE 0.9 % IV SOLN
150.00 | INTRAVENOUS | Status: DC
Start: ? — End: 2018-01-02

## 2018-01-02 MED ORDER — BENZONATATE 100 MG PO CAPS
100.00 | ORAL_CAPSULE | ORAL | Status: DC
Start: ? — End: 2018-01-02

## 2018-01-02 MED ORDER — HYDROCODONE-ACETAMINOPHEN 5-325 MG PO TABS
ORAL_TABLET | ORAL | Status: DC
Start: ? — End: 2018-01-02

## 2018-01-02 MED ORDER — GENERIC EXTERNAL MEDICATION
Status: DC
Start: ? — End: 2018-01-02

## 2018-01-02 MED ORDER — SODIUM CHLORIDE 0.9 % IV SOLN
INTRAVENOUS | Status: DC
Start: ? — End: 2018-01-02

## 2018-01-02 MED ORDER — ACETAMINOPHEN 325 MG PO TABS
650.00 | ORAL_TABLET | ORAL | Status: DC
Start: ? — End: 2018-01-02

## 2018-01-02 MED ORDER — SODIUM CHLORIDE 0.9 % IJ SOLN
50.00 | INTRAMUSCULAR | Status: DC
Start: ? — End: 2018-01-02

## 2018-01-02 MED ORDER — NITROGLYCERIN 0.4 MG SL SUBL
.40 | SUBLINGUAL_TABLET | SUBLINGUAL | Status: DC
Start: ? — End: 2018-01-02

## 2018-01-02 MED ORDER — DARBEPOETIN ALFA 60 MCG/ML IJ SOLN
INTRAMUSCULAR | Status: DC
Start: 2018-01-03 — End: 2018-01-02

## 2018-01-02 MED ORDER — METOPROLOL TARTRATE 50 MG PO TABS
100.00 | ORAL_TABLET | ORAL | Status: DC
Start: 2018-01-07 — End: 2018-01-02

## 2018-01-02 MED ORDER — POLYETHYLENE GLYCOL 3350 17 G PO PACK
17.00 | PACK | ORAL | Status: DC
Start: ? — End: 2018-01-02

## 2018-01-02 MED ORDER — HYDRALAZINE HCL 20 MG/ML IJ SOLN
10.00 | INTRAMUSCULAR | Status: DC
Start: ? — End: 2018-01-02

## 2018-01-02 MED ORDER — SODIUM BICARBONATE 650 MG PO TABS
650.00 | ORAL_TABLET | ORAL | Status: DC
Start: 2018-01-07 — End: 2018-01-02

## 2018-01-02 MED ORDER — CALCIUM CARBONATE ANTACID 500 MG PO CHEW
1000.00 | CHEWABLE_TABLET | ORAL | Status: DC
Start: 2018-01-07 — End: 2018-01-02

## 2018-01-02 MED ORDER — AMLODIPINE BESYLATE 10 MG PO TABS
10.00 | ORAL_TABLET | ORAL | Status: DC
Start: 2018-01-07 — End: 2018-01-02

## 2018-01-02 NOTE — Telephone Encounter (Signed)
Patient's sister called and reports patient is still in the hospital.

## 2018-01-02 NOTE — Telephone Encounter (Signed)
Sorry, she did have me update the mobile number for Mr Sher. I think that was the number she wanted [place] to call.

## 2018-01-02 NOTE — Telephone Encounter (Signed)
Did she leave a different phone number. The one below is her old work number.  It is not a cell or home phone.

## 2018-01-05 ENCOUNTER — Ambulatory Visit: Payer: Medicare Other | Admitting: Family Medicine

## 2018-01-08 ENCOUNTER — Telehealth: Payer: Self-pay | Admitting: Family Medicine

## 2018-01-08 MED ORDER — ONDANSETRON HCL 4 MG/2ML IJ SOLN
4.00 | INTRAMUSCULAR | Status: DC
Start: ? — End: 2018-01-08

## 2018-01-08 MED ORDER — FERROUS SULFATE 325 (65 FE) MG PO TABS
325.00 | ORAL_TABLET | ORAL | Status: DC
Start: 2018-01-07 — End: 2018-01-08

## 2018-01-08 MED ORDER — GENERIC EXTERNAL MEDICATION
Status: DC
Start: ? — End: 2018-01-08

## 2018-01-08 MED ORDER — ALTEPLASE 2 MG IJ SOLR
2.00 | INTRAMUSCULAR | Status: DC
Start: ? — End: 2018-01-08

## 2018-01-08 MED ORDER — HEPARIN SODIUM (PORCINE) 1000 UNIT/ML IJ SOLN
INTRAMUSCULAR | Status: DC
Start: ? — End: 2018-01-08

## 2018-01-08 MED ORDER — SENNA 8.6 MG PO TABS
ORAL_TABLET | ORAL | Status: DC
Start: 2018-01-08 — End: 2018-01-08

## 2018-01-08 MED ORDER — SODIUM CHLORIDE 0.9 % IJ SOLN
50.00 | INTRAMUSCULAR | Status: DC
Start: ? — End: 2018-01-08

## 2018-01-08 MED ORDER — ALBUMIN HUMAN 25 % IV SOLN
12.50 | INTRAVENOUS | Status: DC
Start: ? — End: 2018-01-08

## 2018-01-08 MED ORDER — DIPHENHYDRAMINE HCL 50 MG/ML IJ SOLN
12.50 | INTRAMUSCULAR | Status: DC
Start: ? — End: 2018-01-08

## 2018-01-08 MED ORDER — AMLODIPINE BESYLATE 10 MG PO TABS: 10.0000 mg | ORAL_TABLET | Freq: Every day | ORAL | 5 refills | Status: AC

## 2018-01-08 MED ORDER — SODIUM CHLORIDE 0.9 % IV SOLN
150.00 | INTRAVENOUS | Status: DC
Start: ? — End: 2018-01-08

## 2018-01-08 MED ORDER — METOPROLOL TARTRATE 100 MG PO TABS: 100.0000 mg | ORAL_TABLET | Freq: Two times a day (BID) | ORAL | 5 refills | Status: AC

## 2018-01-08 NOTE — Telephone Encounter (Signed)
Pt's sister called to advise Pt has been discharged from the hospital. While inpatient they were giving him metoprolol and amlodipine. Requesting refills on these Rx's from PCP. Pharmacy on file is correct. Pended old rx's from med list, routing to PCP.

## 2018-01-08 NOTE — Telephone Encounter (Signed)
Meds filled

## 2018-01-10 DIAGNOSIS — D509 Iron deficiency anemia, unspecified: Secondary | ICD-10-CM | POA: Diagnosis not present

## 2018-01-10 DIAGNOSIS — Z1159 Encounter for screening for other viral diseases: Secondary | ICD-10-CM | POA: Diagnosis not present

## 2018-01-10 DIAGNOSIS — N186 End stage renal disease: Secondary | ICD-10-CM | POA: Diagnosis not present

## 2018-01-10 DIAGNOSIS — N2581 Secondary hyperparathyroidism of renal origin: Secondary | ICD-10-CM | POA: Diagnosis not present

## 2018-01-13 DIAGNOSIS — N186 End stage renal disease: Secondary | ICD-10-CM | POA: Diagnosis not present

## 2018-01-13 DIAGNOSIS — N2581 Secondary hyperparathyroidism of renal origin: Secondary | ICD-10-CM | POA: Diagnosis not present

## 2018-01-13 DIAGNOSIS — D509 Iron deficiency anemia, unspecified: Secondary | ICD-10-CM | POA: Diagnosis not present

## 2018-01-15 DIAGNOSIS — D509 Iron deficiency anemia, unspecified: Secondary | ICD-10-CM | POA: Diagnosis not present

## 2018-01-15 DIAGNOSIS — N2581 Secondary hyperparathyroidism of renal origin: Secondary | ICD-10-CM | POA: Diagnosis not present

## 2018-01-15 DIAGNOSIS — N186 End stage renal disease: Secondary | ICD-10-CM | POA: Diagnosis not present

## 2018-01-17 DIAGNOSIS — N2581 Secondary hyperparathyroidism of renal origin: Secondary | ICD-10-CM | POA: Diagnosis not present

## 2018-01-17 DIAGNOSIS — D631 Anemia in chronic kidney disease: Secondary | ICD-10-CM | POA: Diagnosis not present

## 2018-01-17 DIAGNOSIS — D509 Iron deficiency anemia, unspecified: Secondary | ICD-10-CM | POA: Diagnosis not present

## 2018-01-17 DIAGNOSIS — N186 End stage renal disease: Secondary | ICD-10-CM | POA: Diagnosis not present

## 2018-01-20 DIAGNOSIS — D509 Iron deficiency anemia, unspecified: Secondary | ICD-10-CM | POA: Diagnosis not present

## 2018-01-20 DIAGNOSIS — N2581 Secondary hyperparathyroidism of renal origin: Secondary | ICD-10-CM | POA: Diagnosis not present

## 2018-01-20 DIAGNOSIS — N186 End stage renal disease: Secondary | ICD-10-CM | POA: Diagnosis not present

## 2018-01-20 DIAGNOSIS — D631 Anemia in chronic kidney disease: Secondary | ICD-10-CM | POA: Diagnosis not present

## 2018-01-22 DIAGNOSIS — D631 Anemia in chronic kidney disease: Secondary | ICD-10-CM | POA: Diagnosis not present

## 2018-01-22 DIAGNOSIS — D509 Iron deficiency anemia, unspecified: Secondary | ICD-10-CM | POA: Diagnosis not present

## 2018-01-22 DIAGNOSIS — N186 End stage renal disease: Secondary | ICD-10-CM | POA: Diagnosis not present

## 2018-01-22 DIAGNOSIS — N2581 Secondary hyperparathyroidism of renal origin: Secondary | ICD-10-CM | POA: Diagnosis not present

## 2018-01-24 DIAGNOSIS — D509 Iron deficiency anemia, unspecified: Secondary | ICD-10-CM | POA: Diagnosis not present

## 2018-01-24 DIAGNOSIS — D631 Anemia in chronic kidney disease: Secondary | ICD-10-CM | POA: Diagnosis not present

## 2018-01-24 DIAGNOSIS — N186 End stage renal disease: Secondary | ICD-10-CM | POA: Diagnosis not present

## 2018-01-24 DIAGNOSIS — N2581 Secondary hyperparathyroidism of renal origin: Secondary | ICD-10-CM | POA: Diagnosis not present

## 2018-01-27 DIAGNOSIS — D509 Iron deficiency anemia, unspecified: Secondary | ICD-10-CM | POA: Diagnosis not present

## 2018-01-27 DIAGNOSIS — N186 End stage renal disease: Secondary | ICD-10-CM | POA: Diagnosis not present

## 2018-01-29 DIAGNOSIS — N186 End stage renal disease: Secondary | ICD-10-CM | POA: Diagnosis not present

## 2018-01-29 DIAGNOSIS — D509 Iron deficiency anemia, unspecified: Secondary | ICD-10-CM | POA: Diagnosis not present

## 2018-01-31 DIAGNOSIS — D509 Iron deficiency anemia, unspecified: Secondary | ICD-10-CM | POA: Diagnosis not present

## 2018-01-31 DIAGNOSIS — N186 End stage renal disease: Secondary | ICD-10-CM | POA: Diagnosis not present

## 2018-02-03 DIAGNOSIS — N186 End stage renal disease: Secondary | ICD-10-CM | POA: Diagnosis not present

## 2018-02-03 DIAGNOSIS — D509 Iron deficiency anemia, unspecified: Secondary | ICD-10-CM | POA: Diagnosis not present

## 2018-02-05 DIAGNOSIS — D631 Anemia in chronic kidney disease: Secondary | ICD-10-CM | POA: Diagnosis not present

## 2018-02-05 DIAGNOSIS — D509 Iron deficiency anemia, unspecified: Secondary | ICD-10-CM | POA: Diagnosis not present

## 2018-02-05 DIAGNOSIS — N186 End stage renal disease: Secondary | ICD-10-CM | POA: Diagnosis not present

## 2018-02-07 DIAGNOSIS — D631 Anemia in chronic kidney disease: Secondary | ICD-10-CM | POA: Diagnosis not present

## 2018-02-07 DIAGNOSIS — N186 End stage renal disease: Secondary | ICD-10-CM | POA: Diagnosis not present

## 2018-02-07 DIAGNOSIS — D509 Iron deficiency anemia, unspecified: Secondary | ICD-10-CM | POA: Diagnosis not present

## 2018-02-10 DIAGNOSIS — D631 Anemia in chronic kidney disease: Secondary | ICD-10-CM | POA: Diagnosis not present

## 2018-02-10 DIAGNOSIS — N186 End stage renal disease: Secondary | ICD-10-CM | POA: Diagnosis not present

## 2018-02-10 DIAGNOSIS — D509 Iron deficiency anemia, unspecified: Secondary | ICD-10-CM | POA: Diagnosis not present

## 2018-02-12 DIAGNOSIS — D509 Iron deficiency anemia, unspecified: Secondary | ICD-10-CM | POA: Diagnosis not present

## 2018-02-12 DIAGNOSIS — D631 Anemia in chronic kidney disease: Secondary | ICD-10-CM | POA: Diagnosis not present

## 2018-02-12 DIAGNOSIS — N186 End stage renal disease: Secondary | ICD-10-CM | POA: Diagnosis not present

## 2018-02-14 DIAGNOSIS — D509 Iron deficiency anemia, unspecified: Secondary | ICD-10-CM | POA: Diagnosis not present

## 2018-02-14 DIAGNOSIS — D631 Anemia in chronic kidney disease: Secondary | ICD-10-CM | POA: Diagnosis not present

## 2018-02-14 DIAGNOSIS — N186 End stage renal disease: Secondary | ICD-10-CM | POA: Diagnosis not present

## 2018-02-15 DIAGNOSIS — N186 End stage renal disease: Secondary | ICD-10-CM | POA: Diagnosis not present

## 2018-02-15 DIAGNOSIS — Z992 Dependence on renal dialysis: Secondary | ICD-10-CM | POA: Diagnosis not present

## 2018-02-17 DIAGNOSIS — D509 Iron deficiency anemia, unspecified: Secondary | ICD-10-CM | POA: Diagnosis not present

## 2018-02-17 DIAGNOSIS — N186 End stage renal disease: Secondary | ICD-10-CM | POA: Diagnosis not present

## 2018-02-17 DIAGNOSIS — N2581 Secondary hyperparathyroidism of renal origin: Secondary | ICD-10-CM | POA: Diagnosis not present

## 2018-02-18 DIAGNOSIS — Z01810 Encounter for preprocedural cardiovascular examination: Secondary | ICD-10-CM | POA: Diagnosis not present

## 2018-02-18 DIAGNOSIS — N186 End stage renal disease: Secondary | ICD-10-CM | POA: Diagnosis not present

## 2018-02-18 DIAGNOSIS — Z992 Dependence on renal dialysis: Secondary | ICD-10-CM | POA: Diagnosis not present

## 2018-02-19 DIAGNOSIS — N186 End stage renal disease: Secondary | ICD-10-CM | POA: Diagnosis not present

## 2018-02-19 DIAGNOSIS — D509 Iron deficiency anemia, unspecified: Secondary | ICD-10-CM | POA: Diagnosis not present

## 2018-02-19 DIAGNOSIS — N2581 Secondary hyperparathyroidism of renal origin: Secondary | ICD-10-CM | POA: Diagnosis not present

## 2018-02-21 DIAGNOSIS — D509 Iron deficiency anemia, unspecified: Secondary | ICD-10-CM | POA: Diagnosis not present

## 2018-02-21 DIAGNOSIS — N186 End stage renal disease: Secondary | ICD-10-CM | POA: Diagnosis not present

## 2018-02-21 DIAGNOSIS — N2581 Secondary hyperparathyroidism of renal origin: Secondary | ICD-10-CM | POA: Diagnosis not present

## 2018-02-24 DIAGNOSIS — N2581 Secondary hyperparathyroidism of renal origin: Secondary | ICD-10-CM | POA: Diagnosis not present

## 2018-02-24 DIAGNOSIS — D509 Iron deficiency anemia, unspecified: Secondary | ICD-10-CM | POA: Diagnosis not present

## 2018-02-24 DIAGNOSIS — N186 End stage renal disease: Secondary | ICD-10-CM | POA: Diagnosis not present

## 2018-02-26 DIAGNOSIS — D631 Anemia in chronic kidney disease: Secondary | ICD-10-CM | POA: Diagnosis not present

## 2018-02-26 DIAGNOSIS — N186 End stage renal disease: Secondary | ICD-10-CM | POA: Diagnosis not present

## 2018-02-26 DIAGNOSIS — D509 Iron deficiency anemia, unspecified: Secondary | ICD-10-CM | POA: Diagnosis not present

## 2018-02-26 DIAGNOSIS — N2581 Secondary hyperparathyroidism of renal origin: Secondary | ICD-10-CM | POA: Diagnosis not present

## 2018-02-27 DIAGNOSIS — I10 Essential (primary) hypertension: Secondary | ICD-10-CM | POA: Diagnosis not present

## 2018-02-27 DIAGNOSIS — N186 End stage renal disease: Secondary | ICD-10-CM | POA: Diagnosis not present

## 2018-02-27 DIAGNOSIS — Z992 Dependence on renal dialysis: Secondary | ICD-10-CM | POA: Diagnosis not present

## 2018-02-28 DIAGNOSIS — N2581 Secondary hyperparathyroidism of renal origin: Secondary | ICD-10-CM | POA: Diagnosis not present

## 2018-02-28 DIAGNOSIS — D509 Iron deficiency anemia, unspecified: Secondary | ICD-10-CM | POA: Diagnosis not present

## 2018-02-28 DIAGNOSIS — N186 End stage renal disease: Secondary | ICD-10-CM | POA: Diagnosis not present

## 2018-02-28 DIAGNOSIS — D631 Anemia in chronic kidney disease: Secondary | ICD-10-CM | POA: Diagnosis not present

## 2018-03-03 DIAGNOSIS — D509 Iron deficiency anemia, unspecified: Secondary | ICD-10-CM | POA: Diagnosis not present

## 2018-03-03 DIAGNOSIS — N186 End stage renal disease: Secondary | ICD-10-CM | POA: Diagnosis not present

## 2018-03-03 DIAGNOSIS — N2581 Secondary hyperparathyroidism of renal origin: Secondary | ICD-10-CM | POA: Diagnosis not present

## 2018-03-03 DIAGNOSIS — D631 Anemia in chronic kidney disease: Secondary | ICD-10-CM | POA: Diagnosis not present

## 2018-03-05 DIAGNOSIS — N186 End stage renal disease: Secondary | ICD-10-CM | POA: Diagnosis not present

## 2018-03-05 DIAGNOSIS — N2581 Secondary hyperparathyroidism of renal origin: Secondary | ICD-10-CM | POA: Diagnosis not present

## 2018-03-05 DIAGNOSIS — D509 Iron deficiency anemia, unspecified: Secondary | ICD-10-CM | POA: Diagnosis not present

## 2018-03-05 DIAGNOSIS — D631 Anemia in chronic kidney disease: Secondary | ICD-10-CM | POA: Diagnosis not present

## 2018-03-07 DIAGNOSIS — N186 End stage renal disease: Secondary | ICD-10-CM | POA: Diagnosis not present

## 2018-03-07 DIAGNOSIS — N2581 Secondary hyperparathyroidism of renal origin: Secondary | ICD-10-CM | POA: Diagnosis not present

## 2018-03-07 DIAGNOSIS — D631 Anemia in chronic kidney disease: Secondary | ICD-10-CM | POA: Diagnosis not present

## 2018-03-07 DIAGNOSIS — D509 Iron deficiency anemia, unspecified: Secondary | ICD-10-CM | POA: Diagnosis not present

## 2018-03-10 DIAGNOSIS — N2581 Secondary hyperparathyroidism of renal origin: Secondary | ICD-10-CM | POA: Diagnosis not present

## 2018-03-10 DIAGNOSIS — D509 Iron deficiency anemia, unspecified: Secondary | ICD-10-CM | POA: Diagnosis not present

## 2018-03-10 DIAGNOSIS — D631 Anemia in chronic kidney disease: Secondary | ICD-10-CM | POA: Diagnosis not present

## 2018-03-10 DIAGNOSIS — N186 End stage renal disease: Secondary | ICD-10-CM | POA: Diagnosis not present

## 2018-03-10 DIAGNOSIS — Z23 Encounter for immunization: Secondary | ICD-10-CM | POA: Diagnosis not present

## 2018-03-12 DIAGNOSIS — N186 End stage renal disease: Secondary | ICD-10-CM | POA: Diagnosis not present

## 2018-03-12 DIAGNOSIS — N2581 Secondary hyperparathyroidism of renal origin: Secondary | ICD-10-CM | POA: Diagnosis not present

## 2018-03-12 DIAGNOSIS — Z23 Encounter for immunization: Secondary | ICD-10-CM | POA: Diagnosis not present

## 2018-03-12 DIAGNOSIS — D631 Anemia in chronic kidney disease: Secondary | ICD-10-CM | POA: Diagnosis not present

## 2018-03-12 DIAGNOSIS — D509 Iron deficiency anemia, unspecified: Secondary | ICD-10-CM | POA: Diagnosis not present

## 2018-03-14 DIAGNOSIS — D631 Anemia in chronic kidney disease: Secondary | ICD-10-CM | POA: Diagnosis not present

## 2018-03-14 DIAGNOSIS — D509 Iron deficiency anemia, unspecified: Secondary | ICD-10-CM | POA: Diagnosis not present

## 2018-03-14 DIAGNOSIS — N186 End stage renal disease: Secondary | ICD-10-CM | POA: Diagnosis not present

## 2018-03-14 DIAGNOSIS — Z23 Encounter for immunization: Secondary | ICD-10-CM | POA: Diagnosis not present

## 2018-03-14 DIAGNOSIS — N2581 Secondary hyperparathyroidism of renal origin: Secondary | ICD-10-CM | POA: Diagnosis not present

## 2018-03-17 DIAGNOSIS — Z23 Encounter for immunization: Secondary | ICD-10-CM | POA: Diagnosis not present

## 2018-03-17 DIAGNOSIS — Z992 Dependence on renal dialysis: Secondary | ICD-10-CM | POA: Diagnosis not present

## 2018-03-17 DIAGNOSIS — D631 Anemia in chronic kidney disease: Secondary | ICD-10-CM | POA: Diagnosis not present

## 2018-03-17 DIAGNOSIS — D509 Iron deficiency anemia, unspecified: Secondary | ICD-10-CM | POA: Diagnosis not present

## 2018-03-17 DIAGNOSIS — N2581 Secondary hyperparathyroidism of renal origin: Secondary | ICD-10-CM | POA: Diagnosis not present

## 2018-03-17 DIAGNOSIS — N186 End stage renal disease: Secondary | ICD-10-CM | POA: Diagnosis not present

## 2018-03-18 DIAGNOSIS — I120 Hypertensive chronic kidney disease with stage 5 chronic kidney disease or end stage renal disease: Secondary | ICD-10-CM | POA: Diagnosis not present

## 2018-03-18 DIAGNOSIS — N186 End stage renal disease: Secondary | ICD-10-CM | POA: Diagnosis not present

## 2018-03-19 DIAGNOSIS — D631 Anemia in chronic kidney disease: Secondary | ICD-10-CM | POA: Diagnosis not present

## 2018-03-19 DIAGNOSIS — N186 End stage renal disease: Secondary | ICD-10-CM | POA: Diagnosis not present

## 2018-03-19 DIAGNOSIS — N2581 Secondary hyperparathyroidism of renal origin: Secondary | ICD-10-CM | POA: Diagnosis not present

## 2018-03-19 DIAGNOSIS — D509 Iron deficiency anemia, unspecified: Secondary | ICD-10-CM | POA: Diagnosis not present

## 2018-03-21 DIAGNOSIS — N186 End stage renal disease: Secondary | ICD-10-CM | POA: Diagnosis not present

## 2018-03-21 DIAGNOSIS — D631 Anemia in chronic kidney disease: Secondary | ICD-10-CM | POA: Diagnosis not present

## 2018-03-21 DIAGNOSIS — N2581 Secondary hyperparathyroidism of renal origin: Secondary | ICD-10-CM | POA: Diagnosis not present

## 2018-03-21 DIAGNOSIS — D509 Iron deficiency anemia, unspecified: Secondary | ICD-10-CM | POA: Diagnosis not present

## 2018-03-24 DIAGNOSIS — N2581 Secondary hyperparathyroidism of renal origin: Secondary | ICD-10-CM | POA: Diagnosis not present

## 2018-03-24 DIAGNOSIS — D509 Iron deficiency anemia, unspecified: Secondary | ICD-10-CM | POA: Diagnosis not present

## 2018-03-24 DIAGNOSIS — N186 End stage renal disease: Secondary | ICD-10-CM | POA: Diagnosis not present

## 2018-03-24 DIAGNOSIS — D631 Anemia in chronic kidney disease: Secondary | ICD-10-CM | POA: Diagnosis not present

## 2018-03-26 DIAGNOSIS — D509 Iron deficiency anemia, unspecified: Secondary | ICD-10-CM | POA: Diagnosis not present

## 2018-03-26 DIAGNOSIS — N2581 Secondary hyperparathyroidism of renal origin: Secondary | ICD-10-CM | POA: Diagnosis not present

## 2018-03-26 DIAGNOSIS — D631 Anemia in chronic kidney disease: Secondary | ICD-10-CM | POA: Diagnosis not present

## 2018-03-26 DIAGNOSIS — N186 End stage renal disease: Secondary | ICD-10-CM | POA: Diagnosis not present

## 2018-03-28 DIAGNOSIS — N2581 Secondary hyperparathyroidism of renal origin: Secondary | ICD-10-CM | POA: Diagnosis not present

## 2018-03-28 DIAGNOSIS — N186 End stage renal disease: Secondary | ICD-10-CM | POA: Diagnosis not present

## 2018-03-28 DIAGNOSIS — D509 Iron deficiency anemia, unspecified: Secondary | ICD-10-CM | POA: Diagnosis not present

## 2018-03-31 DIAGNOSIS — N2581 Secondary hyperparathyroidism of renal origin: Secondary | ICD-10-CM | POA: Diagnosis not present

## 2018-03-31 DIAGNOSIS — N186 End stage renal disease: Secondary | ICD-10-CM | POA: Diagnosis not present

## 2018-03-31 DIAGNOSIS — D509 Iron deficiency anemia, unspecified: Secondary | ICD-10-CM | POA: Diagnosis not present

## 2018-04-02 DIAGNOSIS — D509 Iron deficiency anemia, unspecified: Secondary | ICD-10-CM | POA: Diagnosis not present

## 2018-04-02 DIAGNOSIS — N186 End stage renal disease: Secondary | ICD-10-CM | POA: Diagnosis not present

## 2018-04-02 DIAGNOSIS — N2581 Secondary hyperparathyroidism of renal origin: Secondary | ICD-10-CM | POA: Diagnosis not present

## 2018-04-04 DIAGNOSIS — D509 Iron deficiency anemia, unspecified: Secondary | ICD-10-CM | POA: Diagnosis not present

## 2018-04-04 DIAGNOSIS — N2581 Secondary hyperparathyroidism of renal origin: Secondary | ICD-10-CM | POA: Diagnosis not present

## 2018-04-04 DIAGNOSIS — N186 End stage renal disease: Secondary | ICD-10-CM | POA: Diagnosis not present

## 2018-04-07 DIAGNOSIS — N186 End stage renal disease: Secondary | ICD-10-CM | POA: Diagnosis not present

## 2018-04-07 DIAGNOSIS — D631 Anemia in chronic kidney disease: Secondary | ICD-10-CM | POA: Diagnosis not present

## 2018-04-07 DIAGNOSIS — D509 Iron deficiency anemia, unspecified: Secondary | ICD-10-CM | POA: Diagnosis not present

## 2018-04-07 DIAGNOSIS — N2581 Secondary hyperparathyroidism of renal origin: Secondary | ICD-10-CM | POA: Diagnosis not present

## 2018-04-09 DIAGNOSIS — D509 Iron deficiency anemia, unspecified: Secondary | ICD-10-CM | POA: Diagnosis not present

## 2018-04-09 DIAGNOSIS — N2581 Secondary hyperparathyroidism of renal origin: Secondary | ICD-10-CM | POA: Diagnosis not present

## 2018-04-09 DIAGNOSIS — D631 Anemia in chronic kidney disease: Secondary | ICD-10-CM | POA: Diagnosis not present

## 2018-04-09 DIAGNOSIS — N186 End stage renal disease: Secondary | ICD-10-CM | POA: Diagnosis not present

## 2018-04-10 DIAGNOSIS — I63512 Cerebral infarction due to unspecified occlusion or stenosis of left middle cerebral artery: Secondary | ICD-10-CM | POA: Diagnosis not present

## 2018-04-10 DIAGNOSIS — S42201K Unspecified fracture of upper end of right humerus, subsequent encounter for fracture with nonunion: Secondary | ICD-10-CM | POA: Diagnosis not present

## 2018-04-10 DIAGNOSIS — E785 Hyperlipidemia, unspecified: Secondary | ICD-10-CM | POA: Diagnosis not present

## 2018-04-10 DIAGNOSIS — I69391 Dysphagia following cerebral infarction: Secondary | ICD-10-CM | POA: Diagnosis not present

## 2018-04-10 DIAGNOSIS — I63412 Cerebral infarction due to embolism of left middle cerebral artery: Secondary | ICD-10-CM | POA: Diagnosis not present

## 2018-04-10 DIAGNOSIS — R531 Weakness: Secondary | ICD-10-CM | POA: Diagnosis not present

## 2018-04-10 DIAGNOSIS — N400 Enlarged prostate without lower urinary tract symptoms: Secondary | ICD-10-CM | POA: Diagnosis not present

## 2018-04-10 DIAGNOSIS — R1311 Dysphagia, oral phase: Secondary | ICD-10-CM | POA: Diagnosis not present

## 2018-04-10 DIAGNOSIS — R29706 NIHSS score 6: Secondary | ICD-10-CM | POA: Diagnosis not present

## 2018-04-10 DIAGNOSIS — S42301K Unspecified fracture of shaft of humerus, right arm, subsequent encounter for fracture with nonunion: Secondary | ICD-10-CM | POA: Diagnosis not present

## 2018-04-10 DIAGNOSIS — N390 Urinary tract infection, site not specified: Secondary | ICD-10-CM | POA: Diagnosis not present

## 2018-04-10 DIAGNOSIS — R402441 Other coma, without documented Glasgow coma scale score, or with partial score reported, in the field [EMT or ambulance]: Secondary | ICD-10-CM | POA: Diagnosis not present

## 2018-04-10 DIAGNOSIS — B9620 Unspecified Escherichia coli [E. coli] as the cause of diseases classified elsewhere: Secondary | ICD-10-CM | POA: Diagnosis not present

## 2018-04-10 DIAGNOSIS — R29810 Facial weakness: Secondary | ICD-10-CM | POA: Diagnosis not present

## 2018-04-10 DIAGNOSIS — I69351 Hemiplegia and hemiparesis following cerebral infarction affecting right dominant side: Secondary | ICD-10-CM | POA: Diagnosis not present

## 2018-04-10 DIAGNOSIS — D509 Iron deficiency anemia, unspecified: Secondary | ICD-10-CM | POA: Diagnosis not present

## 2018-04-10 DIAGNOSIS — G459 Transient cerebral ischemic attack, unspecified: Secondary | ICD-10-CM | POA: Diagnosis not present

## 2018-04-10 DIAGNOSIS — N3090 Cystitis, unspecified without hematuria: Secondary | ICD-10-CM | POA: Diagnosis not present

## 2018-04-10 DIAGNOSIS — I517 Cardiomegaly: Secondary | ICD-10-CM | POA: Diagnosis not present

## 2018-04-10 DIAGNOSIS — I080 Rheumatic disorders of both mitral and aortic valves: Secondary | ICD-10-CM | POA: Diagnosis not present

## 2018-04-10 DIAGNOSIS — Z79899 Other long term (current) drug therapy: Secondary | ICD-10-CM | POA: Diagnosis not present

## 2018-04-10 DIAGNOSIS — I69320 Aphasia following cerebral infarction: Secondary | ICD-10-CM | POA: Diagnosis not present

## 2018-04-10 DIAGNOSIS — I10 Essential (primary) hypertension: Secondary | ICD-10-CM | POA: Diagnosis not present

## 2018-04-10 DIAGNOSIS — N186 End stage renal disease: Secondary | ICD-10-CM | POA: Diagnosis not present

## 2018-04-10 DIAGNOSIS — Z9115 Patient's noncompliance with renal dialysis: Secondary | ICD-10-CM | POA: Diagnosis not present

## 2018-04-10 DIAGNOSIS — Z743 Need for continuous supervision: Secondary | ICD-10-CM | POA: Diagnosis not present

## 2018-04-10 DIAGNOSIS — R29818 Other symptoms and signs involving the nervous system: Secondary | ICD-10-CM | POA: Diagnosis not present

## 2018-04-10 DIAGNOSIS — Z992 Dependence on renal dialysis: Secondary | ICD-10-CM | POA: Diagnosis not present

## 2018-04-10 DIAGNOSIS — E7800 Pure hypercholesterolemia, unspecified: Secondary | ICD-10-CM | POA: Diagnosis not present

## 2018-04-10 DIAGNOSIS — R29898 Other symptoms and signs involving the musculoskeletal system: Secondary | ICD-10-CM | POA: Diagnosis not present

## 2018-04-10 DIAGNOSIS — D631 Anemia in chronic kidney disease: Secondary | ICD-10-CM | POA: Diagnosis not present

## 2018-04-10 DIAGNOSIS — I639 Cerebral infarction, unspecified: Secondary | ICD-10-CM | POA: Diagnosis not present

## 2018-04-10 DIAGNOSIS — R279 Unspecified lack of coordination: Secondary | ICD-10-CM | POA: Diagnosis not present

## 2018-04-10 DIAGNOSIS — N3000 Acute cystitis without hematuria: Secondary | ICD-10-CM | POA: Diagnosis not present

## 2018-04-10 DIAGNOSIS — I120 Hypertensive chronic kidney disease with stage 5 chronic kidney disease or end stage renal disease: Secondary | ICD-10-CM | POA: Diagnosis not present

## 2018-04-10 DIAGNOSIS — R918 Other nonspecific abnormal finding of lung field: Secondary | ICD-10-CM | POA: Diagnosis not present

## 2018-04-10 DIAGNOSIS — Z452 Encounter for adjustment and management of vascular access device: Secondary | ICD-10-CM | POA: Diagnosis not present

## 2018-04-10 DIAGNOSIS — G8191 Hemiplegia, unspecified affecting right dominant side: Secondary | ICD-10-CM | POA: Diagnosis not present

## 2018-04-10 DIAGNOSIS — R4701 Aphasia: Secondary | ICD-10-CM | POA: Diagnosis not present

## 2018-04-10 DIAGNOSIS — J90 Pleural effusion, not elsewhere classified: Secondary | ICD-10-CM | POA: Diagnosis not present

## 2018-04-15 DIAGNOSIS — D509 Iron deficiency anemia, unspecified: Secondary | ICD-10-CM | POA: Diagnosis not present

## 2018-04-15 DIAGNOSIS — S42201K Unspecified fracture of upper end of right humerus, subsequent encounter for fracture with nonunion: Secondary | ICD-10-CM | POA: Diagnosis not present

## 2018-04-15 DIAGNOSIS — R0989 Other specified symptoms and signs involving the circulatory and respiratory systems: Secondary | ICD-10-CM | POA: Diagnosis not present

## 2018-04-15 DIAGNOSIS — H25811 Combined forms of age-related cataract, right eye: Secondary | ICD-10-CM | POA: Diagnosis not present

## 2018-04-15 DIAGNOSIS — I69320 Aphasia following cerebral infarction: Secondary | ICD-10-CM | POA: Diagnosis not present

## 2018-04-15 DIAGNOSIS — R4701 Aphasia: Secondary | ICD-10-CM | POA: Diagnosis not present

## 2018-04-15 DIAGNOSIS — I69351 Hemiplegia and hemiparesis following cerebral infarction affecting right dominant side: Secondary | ICD-10-CM | POA: Diagnosis not present

## 2018-04-15 DIAGNOSIS — I120 Hypertensive chronic kidney disease with stage 5 chronic kidney disease or end stage renal disease: Secondary | ICD-10-CM | POA: Diagnosis not present

## 2018-04-15 DIAGNOSIS — I10 Essential (primary) hypertension: Secondary | ICD-10-CM | POA: Diagnosis not present

## 2018-04-15 DIAGNOSIS — N3090 Cystitis, unspecified without hematuria: Secondary | ICD-10-CM | POA: Diagnosis not present

## 2018-04-15 DIAGNOSIS — B9620 Unspecified Escherichia coli [E. coli] as the cause of diseases classified elsewhere: Secondary | ICD-10-CM | POA: Diagnosis not present

## 2018-04-15 DIAGNOSIS — I639 Cerebral infarction, unspecified: Secondary | ICD-10-CM | POA: Diagnosis not present

## 2018-04-15 DIAGNOSIS — Z992 Dependence on renal dialysis: Secondary | ICD-10-CM | POA: Diagnosis not present

## 2018-04-15 DIAGNOSIS — N186 End stage renal disease: Secondary | ICD-10-CM | POA: Diagnosis not present

## 2018-04-15 DIAGNOSIS — N184 Chronic kidney disease, stage 4 (severe): Secondary | ICD-10-CM | POA: Diagnosis not present

## 2018-04-15 DIAGNOSIS — E785 Hyperlipidemia, unspecified: Secondary | ICD-10-CM | POA: Diagnosis not present

## 2018-04-15 DIAGNOSIS — I63412 Cerebral infarction due to embolism of left middle cerebral artery: Secondary | ICD-10-CM | POA: Diagnosis not present

## 2018-04-15 DIAGNOSIS — D631 Anemia in chronic kidney disease: Secondary | ICD-10-CM | POA: Diagnosis not present

## 2018-04-15 DIAGNOSIS — R1311 Dysphagia, oral phase: Secondary | ICD-10-CM | POA: Diagnosis not present

## 2018-04-15 DIAGNOSIS — N400 Enlarged prostate without lower urinary tract symptoms: Secondary | ICD-10-CM | POA: Diagnosis not present

## 2018-04-15 DIAGNOSIS — H25812 Combined forms of age-related cataract, left eye: Secondary | ICD-10-CM | POA: Diagnosis not present

## 2018-04-15 DIAGNOSIS — N3000 Acute cystitis without hematuria: Secondary | ICD-10-CM | POA: Diagnosis not present

## 2018-04-15 DIAGNOSIS — I69391 Dysphagia following cerebral infarction: Secondary | ICD-10-CM | POA: Diagnosis not present

## 2018-04-17 DIAGNOSIS — N186 End stage renal disease: Secondary | ICD-10-CM | POA: Diagnosis not present

## 2018-04-17 DIAGNOSIS — Z992 Dependence on renal dialysis: Secondary | ICD-10-CM | POA: Diagnosis not present

## 2018-04-23 DIAGNOSIS — E7800 Pure hypercholesterolemia, unspecified: Secondary | ICD-10-CM | POA: Diagnosis not present

## 2018-04-23 DIAGNOSIS — J320 Chronic maxillary sinusitis: Secondary | ICD-10-CM | POA: Diagnosis not present

## 2018-04-23 DIAGNOSIS — H518 Other specified disorders of binocular movement: Secondary | ICD-10-CM | POA: Diagnosis not present

## 2018-04-23 DIAGNOSIS — N401 Enlarged prostate with lower urinary tract symptoms: Secondary | ICD-10-CM | POA: Diagnosis not present

## 2018-04-23 DIAGNOSIS — H25811 Combined forms of age-related cataract, right eye: Secondary | ICD-10-CM | POA: Diagnosis not present

## 2018-04-23 DIAGNOSIS — I6389 Other cerebral infarction: Secondary | ICD-10-CM | POA: Diagnosis not present

## 2018-04-23 DIAGNOSIS — R531 Weakness: Secondary | ICD-10-CM | POA: Diagnosis not present

## 2018-04-23 DIAGNOSIS — R7881 Bacteremia: Secondary | ICD-10-CM | POA: Diagnosis not present

## 2018-04-23 DIAGNOSIS — I083 Combined rheumatic disorders of mitral, aortic and tricuspid valves: Secondary | ICD-10-CM | POA: Diagnosis not present

## 2018-04-23 DIAGNOSIS — Z515 Encounter for palliative care: Secondary | ICD-10-CM | POA: Diagnosis not present

## 2018-04-23 DIAGNOSIS — K5900 Constipation, unspecified: Secondary | ICD-10-CM | POA: Diagnosis not present

## 2018-04-23 DIAGNOSIS — R4701 Aphasia: Secondary | ICD-10-CM | POA: Diagnosis not present

## 2018-04-23 DIAGNOSIS — Z7982 Long term (current) use of aspirin: Secondary | ICD-10-CM | POA: Diagnosis not present

## 2018-04-23 DIAGNOSIS — I6012 Nontraumatic subarachnoid hemorrhage from left middle cerebral artery: Secondary | ICD-10-CM | POA: Diagnosis not present

## 2018-04-23 DIAGNOSIS — I639 Cerebral infarction, unspecified: Secondary | ICD-10-CM | POA: Diagnosis not present

## 2018-04-23 DIAGNOSIS — N281 Cyst of kidney, acquired: Secondary | ICD-10-CM | POA: Diagnosis not present

## 2018-04-23 DIAGNOSIS — G319 Degenerative disease of nervous system, unspecified: Secondary | ICD-10-CM | POA: Diagnosis not present

## 2018-04-23 DIAGNOSIS — D509 Iron deficiency anemia, unspecified: Secondary | ICD-10-CM | POA: Diagnosis not present

## 2018-04-23 DIAGNOSIS — Z992 Dependence on renal dialysis: Secondary | ICD-10-CM | POA: Diagnosis not present

## 2018-04-23 DIAGNOSIS — I63522 Cerebral infarction due to unspecified occlusion or stenosis of left anterior cerebral artery: Secondary | ICD-10-CM | POA: Diagnosis not present

## 2018-04-23 DIAGNOSIS — I38 Endocarditis, valve unspecified: Secondary | ICD-10-CM | POA: Diagnosis not present

## 2018-04-23 DIAGNOSIS — T827XXA Infection and inflammatory reaction due to other cardiac and vascular devices, implants and grafts, initial encounter: Secondary | ICD-10-CM | POA: Diagnosis not present

## 2018-04-23 DIAGNOSIS — I10 Essential (primary) hypertension: Secondary | ICD-10-CM | POA: Diagnosis not present

## 2018-04-23 DIAGNOSIS — B952 Enterococcus as the cause of diseases classified elsewhere: Secondary | ICD-10-CM | POA: Diagnosis not present

## 2018-04-23 DIAGNOSIS — A4181 Sepsis due to Enterococcus: Secondary | ICD-10-CM | POA: Diagnosis not present

## 2018-04-23 DIAGNOSIS — I081 Rheumatic disorders of both mitral and tricuspid valves: Secondary | ICD-10-CM | POA: Diagnosis not present

## 2018-04-23 DIAGNOSIS — N136 Pyonephrosis: Secondary | ICD-10-CM | POA: Diagnosis not present

## 2018-04-23 DIAGNOSIS — N186 End stage renal disease: Secondary | ICD-10-CM | POA: Diagnosis not present

## 2018-04-23 DIAGNOSIS — R29818 Other symptoms and signs involving the nervous system: Secondary | ICD-10-CM | POA: Diagnosis not present

## 2018-04-23 DIAGNOSIS — I69320 Aphasia following cerebral infarction: Secondary | ICD-10-CM | POA: Diagnosis not present

## 2018-04-23 DIAGNOSIS — I120 Hypertensive chronic kidney disease with stage 5 chronic kidney disease or end stage renal disease: Secondary | ICD-10-CM | POA: Diagnosis not present

## 2018-04-23 DIAGNOSIS — Z8249 Family history of ischemic heart disease and other diseases of the circulatory system: Secondary | ICD-10-CM | POA: Diagnosis not present

## 2018-04-23 DIAGNOSIS — G9340 Encephalopathy, unspecified: Secondary | ICD-10-CM | POA: Diagnosis not present

## 2018-04-23 DIAGNOSIS — N132 Hydronephrosis with renal and ureteral calculous obstruction: Secondary | ICD-10-CM | POA: Diagnosis not present

## 2018-04-23 DIAGNOSIS — I69391 Dysphagia following cerebral infarction: Secondary | ICD-10-CM | POA: Diagnosis not present

## 2018-04-23 DIAGNOSIS — R1310 Dysphagia, unspecified: Secondary | ICD-10-CM | POA: Diagnosis not present

## 2018-04-23 DIAGNOSIS — I959 Hypotension, unspecified: Secondary | ICD-10-CM | POA: Diagnosis not present

## 2018-04-23 DIAGNOSIS — Z66 Do not resuscitate: Secondary | ICD-10-CM | POA: Diagnosis not present

## 2018-04-23 DIAGNOSIS — H25812 Combined forms of age-related cataract, left eye: Secondary | ICD-10-CM | POA: Diagnosis not present

## 2018-04-23 DIAGNOSIS — Z79899 Other long term (current) drug therapy: Secondary | ICD-10-CM | POA: Diagnosis not present

## 2018-04-23 DIAGNOSIS — I080 Rheumatic disorders of both mitral and aortic valves: Secondary | ICD-10-CM | POA: Diagnosis not present

## 2018-04-23 DIAGNOSIS — G459 Transient cerebral ischemic attack, unspecified: Secondary | ICD-10-CM | POA: Diagnosis not present

## 2018-04-23 DIAGNOSIS — R402441 Other coma, without documented Glasgow coma scale score, or with partial score reported, in the field [EMT or ambulance]: Secondary | ICD-10-CM | POA: Diagnosis not present

## 2018-04-23 DIAGNOSIS — R338 Other retention of urine: Secondary | ICD-10-CM | POA: Diagnosis not present

## 2018-04-23 DIAGNOSIS — I671 Cerebral aneurysm, nonruptured: Secondary | ICD-10-CM | POA: Diagnosis not present

## 2018-04-23 DIAGNOSIS — G936 Cerebral edema: Secondary | ICD-10-CM | POA: Diagnosis not present

## 2018-04-23 DIAGNOSIS — G9341 Metabolic encephalopathy: Secondary | ICD-10-CM | POA: Diagnosis not present

## 2018-04-23 DIAGNOSIS — R29714 NIHSS score 14: Secondary | ICD-10-CM | POA: Diagnosis not present

## 2018-04-23 DIAGNOSIS — G8191 Hemiplegia, unspecified affecting right dominant side: Secondary | ICD-10-CM | POA: Diagnosis not present

## 2018-04-23 DIAGNOSIS — I63412 Cerebral infarction due to embolism of left middle cerebral artery: Secondary | ICD-10-CM | POA: Diagnosis not present

## 2018-04-23 DIAGNOSIS — K862 Cyst of pancreas: Secondary | ICD-10-CM | POA: Diagnosis not present

## 2018-04-23 DIAGNOSIS — I700 Atherosclerosis of aorta: Secondary | ICD-10-CM | POA: Diagnosis not present

## 2018-04-23 DIAGNOSIS — I358 Other nonrheumatic aortic valve disorders: Secondary | ICD-10-CM | POA: Diagnosis not present

## 2018-04-23 DIAGNOSIS — I330 Acute and subacute infective endocarditis: Secondary | ICD-10-CM | POA: Diagnosis not present

## 2018-04-23 DIAGNOSIS — N3000 Acute cystitis without hematuria: Secondary | ICD-10-CM | POA: Diagnosis not present

## 2018-05-18 DEATH — deceased

## 2019-10-09 IMAGING — DX DG SHOULDER 2+V*R*
3 series · 3 of 3 positions shown · non-contrast
Comparison: None.

CLINICAL DATA: Status post fall into tree in [REDACTED], with injury
to the right shoulder. Limited range of motion. Initial encounter.

EXAM:
RIGHT SHOULDER - 2+ VIEW

[shoulder grashey]
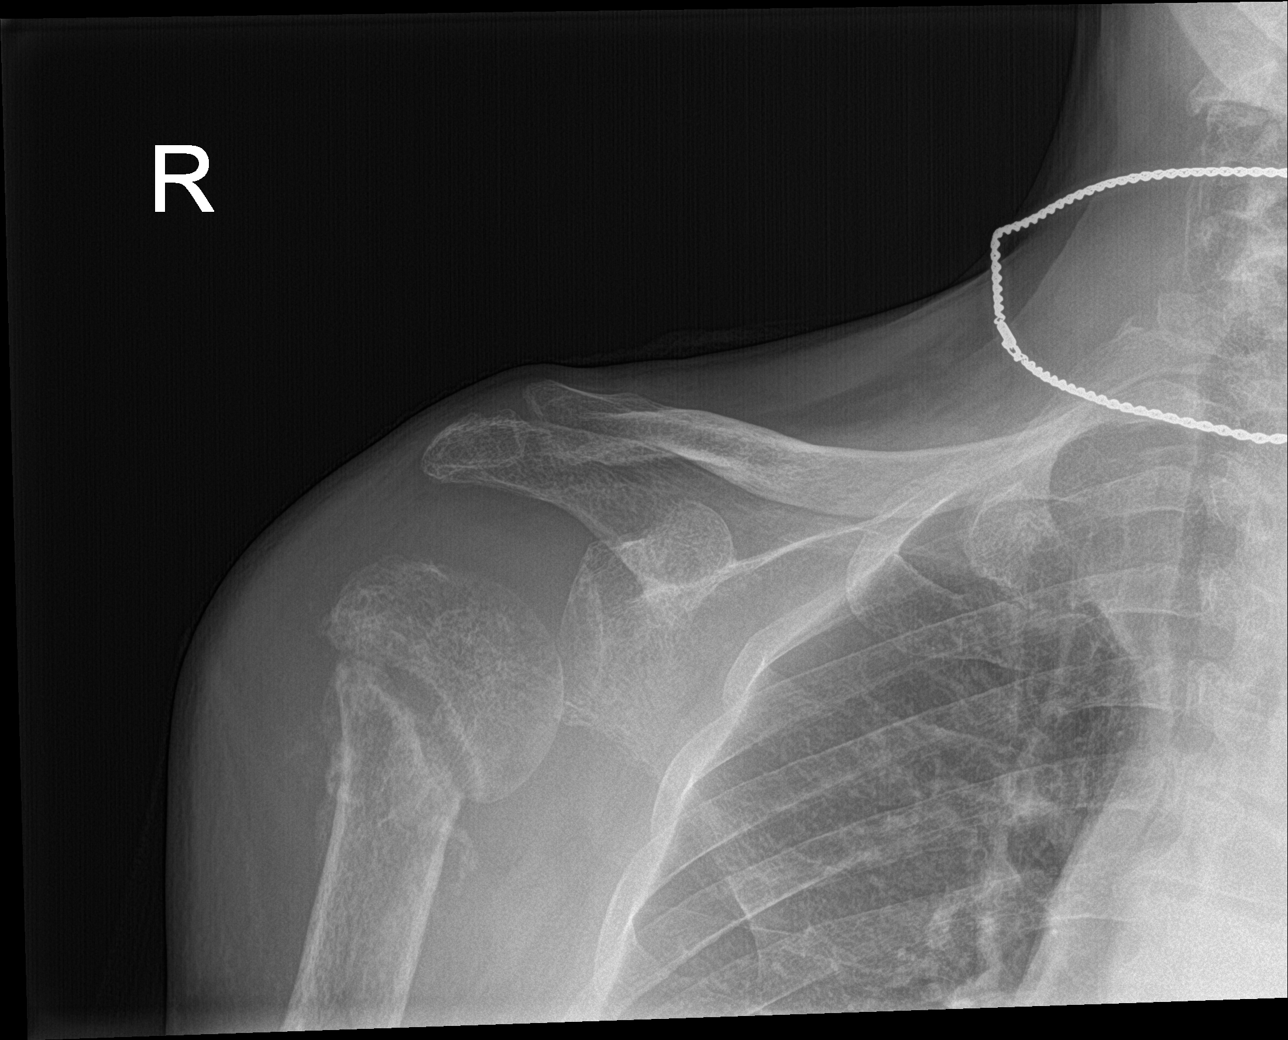

[shoulder y view]
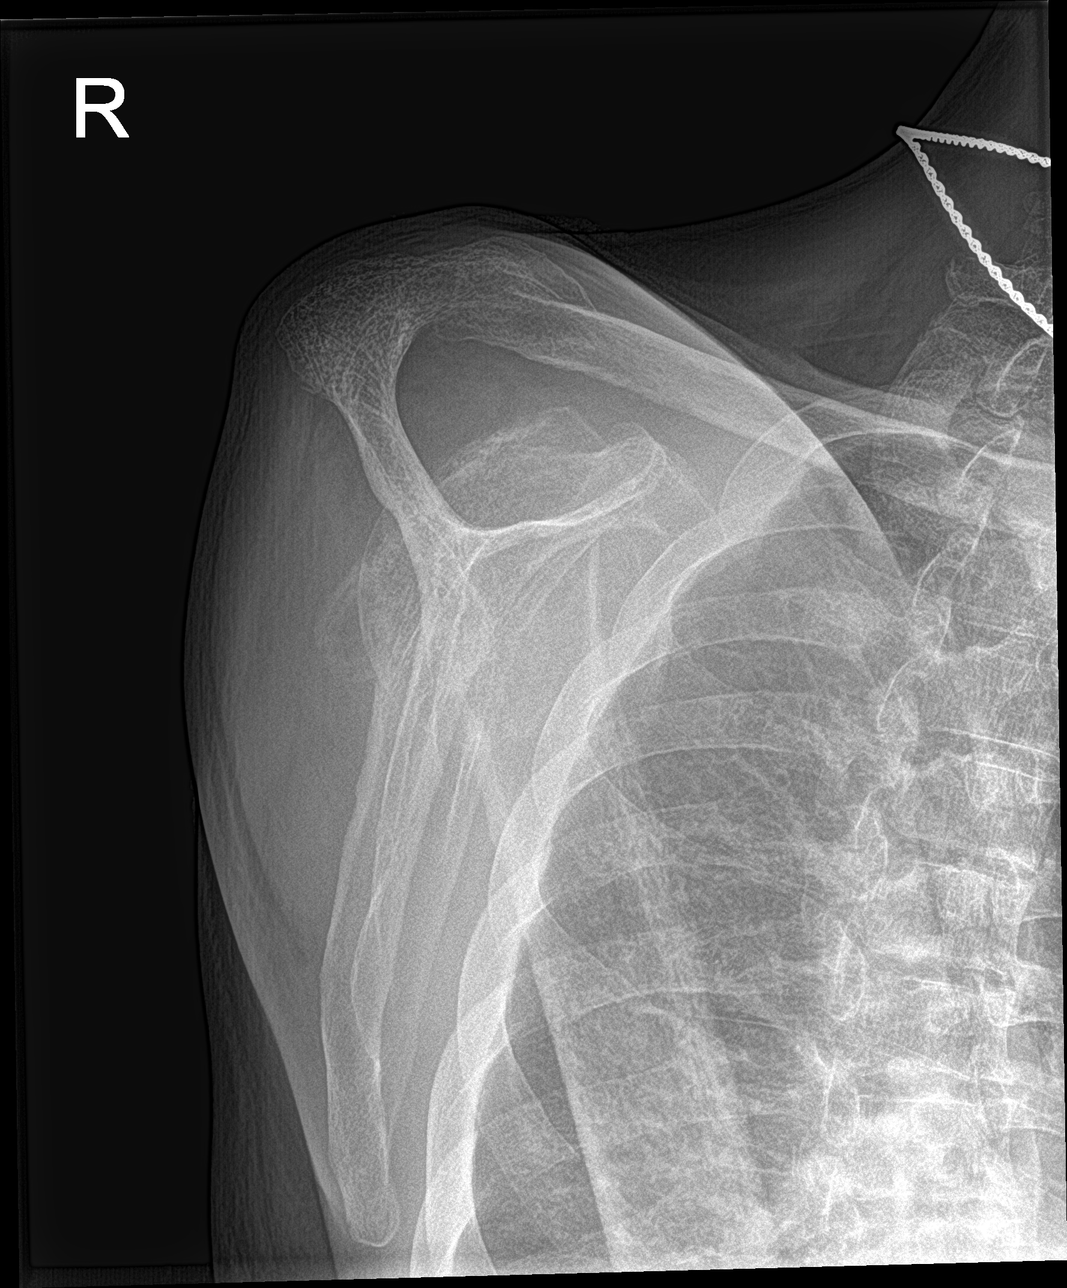

[shoulder axillary]
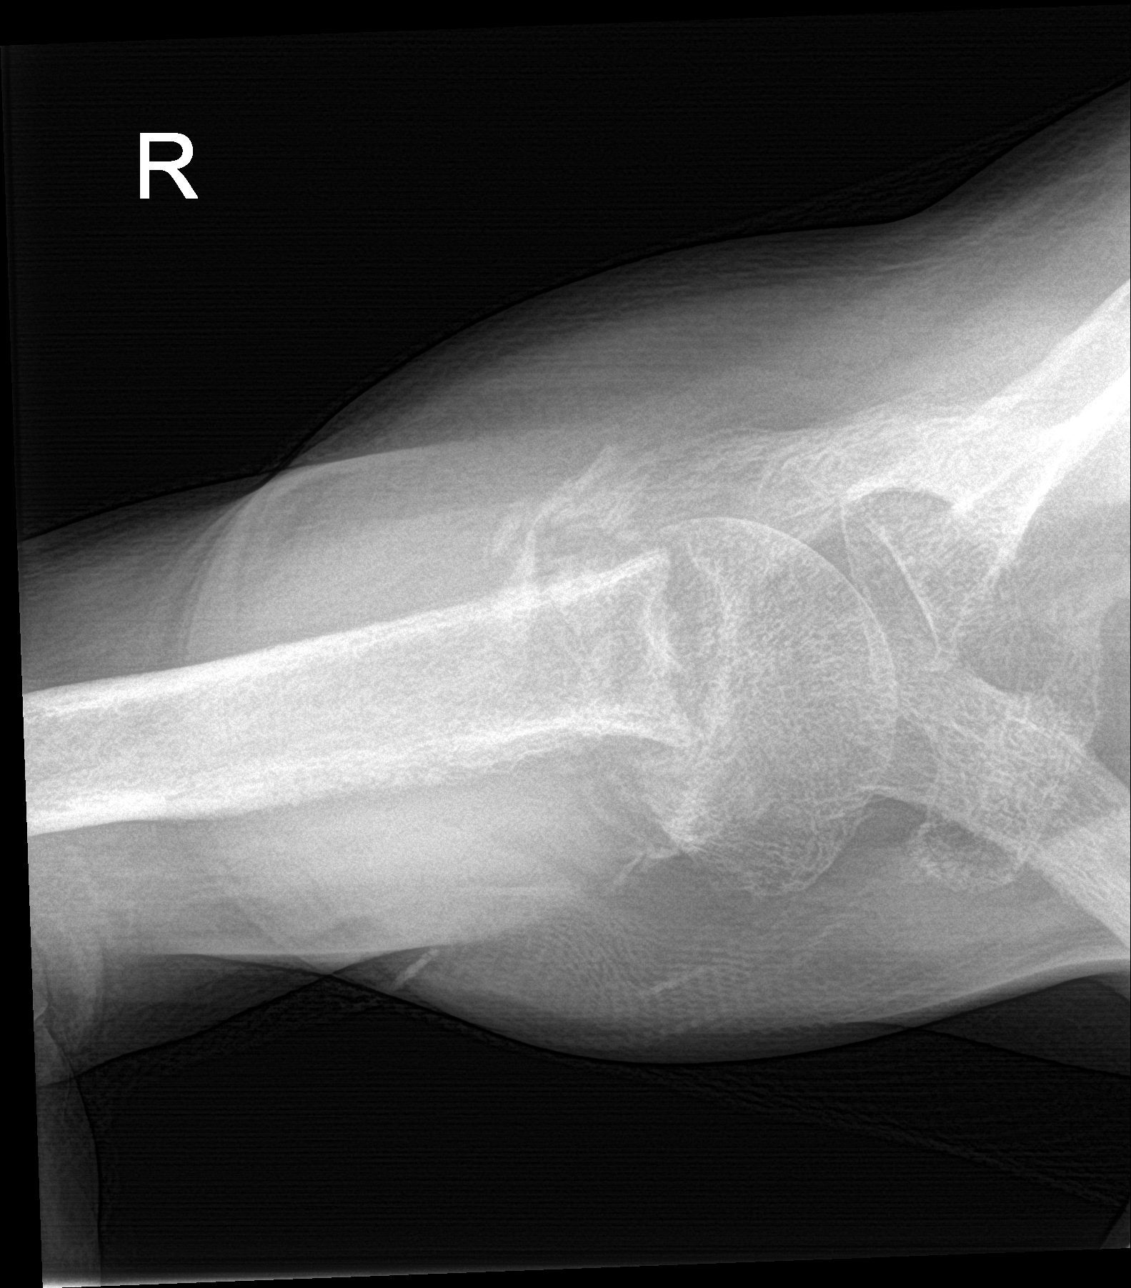

[3 of 3 positions shown; findings below may reference images not displayed]

FINDINGS: There is a chronic fracture through the surgical neck of the right
humerus, with interval resorption. Minimal callus formation is
noted, without significant healing. There is an underlying right
glenohumeral joint effusion, with mild inferior displacement of the
right humeral head.

Mild degenerative change is noted at the right acromioclavicular
joint. The visualized portions of the right lung are clear.
IMPRESSION: 1. Chronic fracture through the surgical neck of the right humerus,
with interval resorption along the fracture line. Minimal callus
formation, without significant healing.
2. Underlying right glenohumeral joint effusion, with mild inferior
displacement of the right humeral head.
